# Patient Record
Sex: Female | Born: 1975 | Race: Black or African American | Hispanic: No | Marital: Single | Smoking: Never smoker
Health system: Southern US, Community
[De-identification: ages and names within clinical notes are randomized; demographics above are authoritative.]

## PROBLEM LIST (undated history)

## (undated) DIAGNOSIS — R87618 Other abnormal cytological findings on specimens from cervix uteri: Secondary | ICD-10-CM

## (undated) DIAGNOSIS — Z87410 Personal history of cervical dysplasia: Secondary | ICD-10-CM

## (undated) DIAGNOSIS — R87619 Unspecified abnormal cytological findings in specimens from cervix uteri: Secondary | ICD-10-CM

## (undated) DIAGNOSIS — R8789 Other abnormal findings in specimens from female genital organs: Secondary | ICD-10-CM

## (undated) DIAGNOSIS — F53 Postpartum depression: Secondary | ICD-10-CM

## (undated) DIAGNOSIS — O99345 Other mental disorders complicating the puerperium: Secondary | ICD-10-CM

## (undated) HISTORY — DX: Unspecified abnormal cytological findings in specimens from cervix uteri: R87.619

## (undated) HISTORY — PX: WISDOM TOOTH EXTRACTION: SHX21

## (undated) HISTORY — DX: Other abnormal cytological findings on specimens from cervix uteri: R87.618

## (undated) HISTORY — DX: Other mental disorders complicating the puerperium: O99.345

## (undated) HISTORY — PX: DILATION AND CURETTAGE OF UTERUS: SHX78

## (undated) HISTORY — PX: BREAST BIOPSY: SHX20

## (undated) HISTORY — DX: Personal history of cervical dysplasia: Z87.410

## (undated) HISTORY — DX: Postpartum depression: F53.0

## (undated) HISTORY — DX: Other abnormal findings in specimens from female genital organs: R87.89

---

## 1997-11-14 ENCOUNTER — Encounter: Payer: Self-pay | Admitting: Obstetrics

## 1997-11-14 ENCOUNTER — Inpatient Hospital Stay (HOSPITAL_COMMUNITY): Admission: AD | Admit: 1997-11-14 | Discharge: 1997-11-14 | Payer: Self-pay | Admitting: Obstetrics

## 1998-01-05 HISTORY — PX: BREAST BIOPSY: SHX20

## 1998-02-27 ENCOUNTER — Inpatient Hospital Stay (HOSPITAL_COMMUNITY): Admission: AD | Admit: 1998-02-27 | Discharge: 1998-02-27 | Payer: Self-pay | Admitting: Obstetrics

## 1998-11-26 ENCOUNTER — Inpatient Hospital Stay (HOSPITAL_COMMUNITY): Admission: AD | Admit: 1998-11-26 | Discharge: 1998-11-26 | Payer: Self-pay | Admitting: *Deleted

## 1998-11-29 ENCOUNTER — Inpatient Hospital Stay (HOSPITAL_COMMUNITY): Admission: AD | Admit: 1998-11-29 | Discharge: 1998-11-29 | Payer: Self-pay | Admitting: *Deleted

## 1999-02-21 ENCOUNTER — Inpatient Hospital Stay (HOSPITAL_COMMUNITY): Admission: AD | Admit: 1999-02-21 | Discharge: 1999-02-21 | Payer: Self-pay | Admitting: *Deleted

## 1999-04-19 ENCOUNTER — Inpatient Hospital Stay (HOSPITAL_COMMUNITY): Admission: AD | Admit: 1999-04-19 | Discharge: 1999-04-19 | Payer: Self-pay | Admitting: *Deleted

## 1999-06-08 ENCOUNTER — Inpatient Hospital Stay (HOSPITAL_COMMUNITY): Admission: AD | Admit: 1999-06-08 | Discharge: 1999-06-08 | Payer: Self-pay | Admitting: Obstetrics

## 1999-08-22 ENCOUNTER — Inpatient Hospital Stay (HOSPITAL_COMMUNITY): Admission: AD | Admit: 1999-08-22 | Discharge: 1999-08-22 | Payer: Self-pay | Admitting: Obstetrics & Gynecology

## 1999-08-27 ENCOUNTER — Inpatient Hospital Stay (HOSPITAL_COMMUNITY): Admission: AD | Admit: 1999-08-27 | Discharge: 1999-08-27 | Payer: Self-pay | Admitting: Obstetrics

## 1999-09-12 ENCOUNTER — Inpatient Hospital Stay (HOSPITAL_COMMUNITY): Admission: AD | Admit: 1999-09-12 | Discharge: 1999-09-12 | Payer: Self-pay | Admitting: Obstetrics & Gynecology

## 1999-12-03 ENCOUNTER — Inpatient Hospital Stay (HOSPITAL_COMMUNITY): Admission: AD | Admit: 1999-12-03 | Discharge: 1999-12-03 | Payer: Self-pay | Admitting: Obstetrics

## 2000-01-10 ENCOUNTER — Inpatient Hospital Stay (HOSPITAL_COMMUNITY): Admission: AD | Admit: 2000-01-10 | Discharge: 2000-01-10 | Payer: Self-pay | Admitting: *Deleted

## 2000-02-03 ENCOUNTER — Inpatient Hospital Stay (HOSPITAL_COMMUNITY): Admission: AD | Admit: 2000-02-03 | Discharge: 2000-02-03 | Payer: Self-pay | Admitting: Obstetrics & Gynecology

## 2000-04-11 ENCOUNTER — Inpatient Hospital Stay (HOSPITAL_COMMUNITY): Admission: AD | Admit: 2000-04-11 | Discharge: 2000-04-11 | Payer: Self-pay | Admitting: *Deleted

## 2000-07-08 ENCOUNTER — Inpatient Hospital Stay (HOSPITAL_COMMUNITY): Admission: AD | Admit: 2000-07-08 | Discharge: 2000-07-08 | Payer: Self-pay | Admitting: Obstetrics

## 2000-07-19 ENCOUNTER — Inpatient Hospital Stay (HOSPITAL_COMMUNITY): Admission: EM | Admit: 2000-07-19 | Discharge: 2000-07-19 | Payer: Self-pay | Admitting: Obstetrics & Gynecology

## 2000-07-20 ENCOUNTER — Inpatient Hospital Stay (HOSPITAL_COMMUNITY): Admission: AD | Admit: 2000-07-20 | Discharge: 2000-07-20 | Payer: Self-pay | Admitting: Obstetrics

## 2000-09-18 ENCOUNTER — Inpatient Hospital Stay (HOSPITAL_COMMUNITY): Admission: AD | Admit: 2000-09-18 | Discharge: 2000-09-18 | Payer: Self-pay | Admitting: *Deleted

## 2000-09-23 ENCOUNTER — Encounter: Payer: Self-pay | Admitting: Obstetrics & Gynecology

## 2000-09-23 ENCOUNTER — Inpatient Hospital Stay (HOSPITAL_COMMUNITY): Admission: AD | Admit: 2000-09-23 | Discharge: 2000-09-23 | Payer: Self-pay | Admitting: Obstetrics & Gynecology

## 2001-01-17 ENCOUNTER — Inpatient Hospital Stay (HOSPITAL_COMMUNITY): Admission: AD | Admit: 2001-01-17 | Discharge: 2001-01-17 | Payer: Self-pay | Admitting: Obstetrics

## 2001-09-13 ENCOUNTER — Inpatient Hospital Stay (HOSPITAL_COMMUNITY): Admission: AD | Admit: 2001-09-13 | Discharge: 2001-09-13 | Payer: Self-pay | Admitting: *Deleted

## 2001-10-14 ENCOUNTER — Encounter: Payer: Self-pay | Admitting: Emergency Medicine

## 2001-10-14 ENCOUNTER — Emergency Department (HOSPITAL_COMMUNITY): Admission: EM | Admit: 2001-10-14 | Discharge: 2001-10-14 | Payer: Self-pay | Admitting: Emergency Medicine

## 2001-12-23 ENCOUNTER — Inpatient Hospital Stay (HOSPITAL_COMMUNITY): Admission: AD | Admit: 2001-12-23 | Discharge: 2001-12-23 | Payer: Self-pay | Admitting: Family Medicine

## 2002-01-29 ENCOUNTER — Inpatient Hospital Stay (HOSPITAL_COMMUNITY): Admission: AD | Admit: 2002-01-29 | Discharge: 2002-01-29 | Payer: Self-pay | Admitting: Family Medicine

## 2002-02-09 ENCOUNTER — Other Ambulatory Visit: Admission: RE | Admit: 2002-02-09 | Discharge: 2002-02-09 | Payer: Self-pay | Admitting: *Deleted

## 2002-08-05 ENCOUNTER — Inpatient Hospital Stay (HOSPITAL_COMMUNITY): Admission: AD | Admit: 2002-08-05 | Discharge: 2002-08-05 | Payer: Self-pay | Admitting: Gynecology

## 2002-09-07 ENCOUNTER — Encounter: Payer: Self-pay | Admitting: Family Medicine

## 2002-09-07 ENCOUNTER — Inpatient Hospital Stay (HOSPITAL_COMMUNITY): Admission: AD | Admit: 2002-09-07 | Discharge: 2002-09-07 | Payer: Self-pay | Admitting: Family Medicine

## 2002-12-12 ENCOUNTER — Other Ambulatory Visit: Admission: RE | Admit: 2002-12-12 | Discharge: 2002-12-12 | Payer: Self-pay | Admitting: Obstetrics and Gynecology

## 2002-12-13 ENCOUNTER — Other Ambulatory Visit: Admission: RE | Admit: 2002-12-13 | Discharge: 2002-12-13 | Payer: Self-pay | Admitting: Obstetrics and Gynecology

## 2003-01-04 ENCOUNTER — Inpatient Hospital Stay (HOSPITAL_COMMUNITY): Admission: AD | Admit: 2003-01-04 | Discharge: 2003-01-04 | Payer: Self-pay | Admitting: Obstetrics & Gynecology

## 2003-07-17 ENCOUNTER — Emergency Department (HOSPITAL_COMMUNITY): Admission: EM | Admit: 2003-07-17 | Discharge: 2003-07-18 | Payer: Self-pay | Admitting: Emergency Medicine

## 2004-01-17 ENCOUNTER — Other Ambulatory Visit: Admission: RE | Admit: 2004-01-17 | Discharge: 2004-01-17 | Payer: Self-pay | Admitting: Obstetrics and Gynecology

## 2004-11-01 ENCOUNTER — Inpatient Hospital Stay (HOSPITAL_COMMUNITY): Admission: AD | Admit: 2004-11-01 | Discharge: 2004-11-01 | Payer: Self-pay | Admitting: Obstetrics and Gynecology

## 2005-02-23 ENCOUNTER — Other Ambulatory Visit: Admission: RE | Admit: 2005-02-23 | Discharge: 2005-02-23 | Payer: Self-pay | Admitting: Obstetrics and Gynecology

## 2005-05-26 ENCOUNTER — Inpatient Hospital Stay (HOSPITAL_COMMUNITY): Admission: AD | Admit: 2005-05-26 | Discharge: 2005-05-26 | Payer: Self-pay | Admitting: Obstetrics and Gynecology

## 2010-04-28 DIAGNOSIS — G5600 Carpal tunnel syndrome, unspecified upper limb: Secondary | ICD-10-CM | POA: Insufficient documentation

## 2014-03-09 DIAGNOSIS — R87610 Atypical squamous cells of undetermined significance on cytologic smear of cervix (ASC-US): Secondary | ICD-10-CM | POA: Insufficient documentation

## 2014-06-01 ENCOUNTER — Encounter (HOSPITAL_BASED_OUTPATIENT_CLINIC_OR_DEPARTMENT_OTHER): Payer: Self-pay

## 2014-06-01 ENCOUNTER — Emergency Department (HOSPITAL_BASED_OUTPATIENT_CLINIC_OR_DEPARTMENT_OTHER)
Admission: EM | Admit: 2014-06-01 | Discharge: 2014-06-01 | Disposition: A | Discharge status: Home / Self Care | Payer: Medicaid Other | Attending: Emergency Medicine | Admitting: Emergency Medicine

## 2014-06-01 ENCOUNTER — Emergency Department (HOSPITAL_BASED_OUTPATIENT_CLINIC_OR_DEPARTMENT_OTHER): Payer: Medicaid Other

## 2014-06-01 DIAGNOSIS — Y9289 Other specified places as the place of occurrence of the external cause: Secondary | ICD-10-CM | POA: Insufficient documentation

## 2014-06-01 DIAGNOSIS — W501XXA Accidental kick by another person, initial encounter: Secondary | ICD-10-CM | POA: Diagnosis not present

## 2014-06-01 DIAGNOSIS — Y9389 Activity, other specified: Secondary | ICD-10-CM | POA: Insufficient documentation

## 2014-06-01 DIAGNOSIS — M25532 Pain in left wrist: Secondary | ICD-10-CM

## 2014-06-01 DIAGNOSIS — S6992XA Unspecified injury of left wrist, hand and finger(s), initial encounter: Secondary | ICD-10-CM | POA: Diagnosis not present

## 2014-06-01 DIAGNOSIS — Y990 Civilian activity done for income or pay: Secondary | ICD-10-CM | POA: Diagnosis not present

## 2014-06-01 MED ORDER — IBUPROFEN 800 MG PO TABS
800.0000 mg | ORAL_TABLET | Freq: Once | ORAL | Status: AC
  Administered 2014-06-01: 800 mg via ORAL
  Filled 2014-06-01: qty 1
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
## 2014-06-01 NOTE — Discharge Instructions (Signed)
You may take ibuprofen or naproxen for your pain. RICE: Routine Care for Injuries The routine care of many injuries includes Rest, Ice, Compression, and Elevation (RICE). HOME CARE INSTRUCTIONS  Rest is needed to allow your body to heal. Routine activities can usually be resumed when comfortable. Injured tendons and bones can take up to 6 weeks to heal. Tendons are the cord-like structures that attach muscle to bone.  Ice following an injury helps keep the swelling down and reduces pain.  Put ice in a plastic bag.  Place a towel between your skin and the bag.  Leave the ice on for 15-20 minutes, 3-4 times a day, or as directed by your health care provider. Do this while awake, for the first 24 to 48 hours. After that, continue as directed by your caregiver.  Compression helps keep swelling down. It also gives support and helps with discomfort. If an elastic bandage has been applied, it should be removed and reapplied every 3 to 4 hours. It should not be applied tightly, but firmly enough to keep swelling down. Watch fingers or toes for swelling, bluish discoloration, coldness, numbness, or excessive pain. If any of these problems occur, remove the bandage and reapply loosely. Contact your caregiver if these problems continue.  Elevation helps reduce swelling and decreases pain. With extremities, such as the arms, hands, legs, and feet, the injured area should be placed near or above the level of the heart, if possible. SEEK IMMEDIATE MEDICAL CARE IF:  You have persistent pain and swelling.  You develop redness, numbness, or unexpected weakness.  Your symptoms are getting worse rather than improving after several days. These symptoms may indicate that further evaluation or further X-rays are needed. Sometimes, X-rays may not show a small broken bone (fracture) until 1 week or 10 days later. Make a follow-up appointment with your caregiver. Ask when your X-ray results will be ready. Make sure  you get your X-ray results. Document Released: 04/05/2000 Document Revised: 12/27/2012 Document Reviewed: 05/23/2010 [facility] Patient Information 2015 [place], [place]. This information is not intended to replace advice given to you by your health care provider. Make sure you discuss any questions you have with your health care provider.  Wrist Pain Wrist injuries are frequent in adults and children. A sprain is an injury to the ligaments that hold your bones together. A strain is an injury to muscle or muscle cord-like structures (tendons) from stretching or pulling. Generally, when wrists are moderately tender to touch following a fall or injury, a break in the bone (fracture) may be present. Most wrist sprains or strains are better in 3 to 5 days, but complete healing may take several weeks. HOME CARE INSTRUCTIONS   Put ice on the injured area.  Put ice in a plastic bag.  Place a towel between your skin and the bag.  Leave the ice on for 15-20 minutes, 3-4 times a day, for the first 2 days, or as directed by your health care provider.  Keep your arm raised above the level of your heart whenever possible to reduce swelling and pain.  Rest the injured area for at least 48 hours or as directed by your health care provider.  If a splint or elastic bandage has been applied, use it for as long as directed by your health care provider or until seen by a health care provider for a follow-up exam.  Only take over-the-counter or prescription medicines for pain, discomfort, or fever as directed by your health care  provider.  Keep all follow-up appointments. You may need to follow up with a specialist or have follow-up X-rays. Improvement in pain level is not a guarantee that you did not fracture a bone in your wrist. The only way to determine whether or not you have a broken bone is by X-ray. SEEK IMMEDIATE MEDICAL CARE IF:   Your fingers are swollen, very red, white, or cold and blue.  Your  fingers are numb or tingling.  You have increasing pain.  You have difficulty moving your fingers. MAKE SURE YOU:   Understand these instructions.  Will watch your condition.  Will get help right away if you are not doing well or get worse. Document Released: 10/01/2004 Document Revised: 12/27/2012 Document Reviewed: 02/12/2010 [facility] Patient Information 2015 [place], [place]. This information is not intended to replace advice given to you by your health care provider. Make sure you discuss any questions you have with your health care provider.

## 2014-06-01 NOTE — ED Notes (Signed)
Left wrist injury yesterday-pt wearing velcro splint

## 2015-09-03 ENCOUNTER — Ambulatory Visit
Admission: EM | Admit: 2015-09-03 | Discharge: 2015-09-03 | Discharge status: Absent without leave | Payer: Managed Care, Other (non HMO)

## 2015-09-03 ENCOUNTER — Encounter: Payer: Self-pay | Admitting: Emergency Medicine

## 2015-09-03 ENCOUNTER — Ambulatory Visit
Admission: EM | Admit: 2015-09-03 | Discharge: 2015-09-03 | Disposition: A | Discharge status: Home / Self Care | Payer: Managed Care, Other (non HMO) | Attending: Family Medicine | Admitting: Family Medicine

## 2015-09-03 DIAGNOSIS — H109 Unspecified conjunctivitis: Secondary | ICD-10-CM

## 2015-09-03 DIAGNOSIS — H1132 Conjunctival hemorrhage, left eye: Secondary | ICD-10-CM

## 2015-09-03 MED ORDER — GENTAMICIN SULFATE 0.3 % OP SOLN: 2.0000 [drp] | Freq: Three times a day (TID) | OPHTHALMIC | 0 refills | Status: DC

## 2015-09-03 NOTE — ED Triage Notes (Signed)
Patient c/o redness in her left eye that started yesterday.

## 2015-09-03 NOTE — ED Provider Notes (Signed)
[facility]    CSN: [id number] Arrival date & time: 09/03/15  1944  First Provider Contact:  First MD Initiated Contact with Patient 09/03/15 2030        History   Chief Complaint Chief Complaint  Patient presents with  . Eye Problem    HPI Angel Reilly is a 40 y.o. female.   Patient's here because of his redness of the left eye. She states the redness started yesterday I was red and became irritated today. During the 24 hours that started just got a lot worse with marked redness of the left eye and then also some mucus from eyes well. She denies having anything in her left eye. She states last week she has been troubled for contact lenses she does not wear her contacts now. No previous medical history. She's had wrist surgery for carpal tunnel. She had a D&C and wisdom tooth extracted No known drug allergies. No chronic medications she does not smoke. No pertinent family medical history pertaining or related of significance to this visit.   The history is provided by the patient. No language interpreter was used.  Eye Problem  Location:  Left eye Quality:  Burning Severity:  Moderate Onset quality:  Sudden Timing:  Constant Progression:  Worsening Chronicity:  New Context: not burn, not chemical exposure, not direct trauma, not foreign body and not scratch   Relieved by:  None tried Ineffective treatments:  None tried Associated symptoms: crusting, discharge, itching, redness and tearing   Risk factors: conjunctival hemorrhage     History reviewed. No pertinent past medical history.  There are no active problems to display for this patient.   Past Surgical History:  Procedure Laterality Date  . DILATION AND CURETTAGE OF UTERUS    . WISDOM TOOTH EXTRACTION      OB History    No data available       Home Medications    Prior to Admission medications   Medication Sig Start Date End Date Taking? Authorizing Provider  gentamicin (GARAMYCIN) 0.3  % ophthalmic solution Place 2 drops into the left eye 3 (three) times daily. 09/03/15   Frederich Cha, MD    Family History History reviewed. No pertinent family history.  Social History Social History  Substance Use Topics  . Smoking status: Never Smoker  . Smokeless tobacco: Never Used  . Alcohol use No     Allergies   Review of patient's allergies indicates no known allergies.   Review of Systems Review of Systems  Eyes: Positive for pain, discharge, redness and itching.     Physical Exam Triage Vital Signs ED Triage Vitals  Enc Vitals Group     BP 09/03/15 2023 119/63     Pulse Rate 09/03/15 2023 79     Resp 09/03/15 2023 16     Temp 09/03/15 2023 98 F (36.7 C)     Temp Source 09/03/15 2023 Tympanic     SpO2 09/03/15 2023 100 %     Weight 09/03/15 2024 214 lb (97.1 kg)     Height 09/03/15 2024 5\' 7"  (1.702 m)     Head Circumference --      Peak Flow --      Pain Score 09/03/15 2026 1     Pain Loc --      Pain Edu? --      Excl. in [place]? --    No data found.   Updated Vital Signs BP 119/63 (BP Location: Left Arm)  Pulse 79   Temp 98 F (36.7 C) (Tympanic)   Resp 16   Ht 5\' 7"  (1.702 m)   Wt 214 lb (97.1 kg)   LMP 08/25/2015 (Exact Date)   SpO2 100%   BMI 33.52 kg/m   Visual Acuity Right Eye Distance: 20/20 corrected Left Eye Distance: 20/20 corrected Bilateral Distance:    Right Eye Near:   Left Eye Near:    Bilateral Near:     Physical Exam  Constitutional: She appears well-developed and well-nourished.  HENT:  Head: Normocephalic and atraumatic.  Eyes: EOM and lids are normal. Pupils are equal, round, and reactive to light. Right eye exhibits no discharge. Left eye exhibits no discharge. Left conjunctiva is injected. Left conjunctiva has a hemorrhage.    She has what appears be a subconjunctival hemorrhage about 3:00 she has some redness of eyes probably secondary to pinkeye  Neck: Normal range of motion.  Pulmonary/Chest: Effort  normal.  Musculoskeletal: Normal range of motion.  Neurological: She is alert.  Skin: Skin is warm and dry.  Psychiatric: She has a normal mood and affect.  Vitals reviewed.    UC Treatments / Results  Labs (all labs ordered are listed, but only abnormal results are displayed) Labs Reviewed - No data to display  EKG  EKG Interpretation None       Radiology No results found.  Procedures Procedures (including critical care time)  Medications Ordered in UC Medications - No data to display   Initial Impression / Assessment and Plan / UC Course  I have reviewed the triage vital signs and the nursing notes.  Pertinent labs & imaging results that were available during my care of the patient were reviewed by me and considered in my medical decision making (see chart for details).  Clinical Course    Patient uptake has a septic for hemorrhage which I think is coming from self inflicted rubbing from having a conjunctivitis. Recommend she not touch her eye. Gentamycineye drops 2 drops in the left eye 3 times a day. Work note given for tomorrow. Follow-up ophthalmologist choice if not better in the next 48 hours. No contacts for at least a week.  Final Clinical Impressions(s) / UC Diagnoses   Final diagnoses:  Conjunctivitis of left eye  Subconjunctival hemorrhage, left    New Prescriptions New Prescriptions   GENTAMICIN (GARAMYCIN) 0.3 % OPHTHALMIC SOLUTION    Place 2 drops into the left eye 3 (three) times daily.     Frederich Cha, MD 09/03/15 2052

## 2015-11-22 ENCOUNTER — Encounter (HOSPITAL_BASED_OUTPATIENT_CLINIC_OR_DEPARTMENT_OTHER): Payer: Self-pay | Admitting: *Deleted

## 2015-11-22 ENCOUNTER — Emergency Department (HOSPITAL_BASED_OUTPATIENT_CLINIC_OR_DEPARTMENT_OTHER)
Admission: EM | Admit: 2015-11-22 | Discharge: 2015-11-22 | Disposition: A | Discharge status: Home / Self Care | Payer: Managed Care, Other (non HMO) | Attending: Emergency Medicine | Admitting: Emergency Medicine

## 2015-11-22 ENCOUNTER — Emergency Department (HOSPITAL_BASED_OUTPATIENT_CLINIC_OR_DEPARTMENT_OTHER): Payer: Managed Care, Other (non HMO)

## 2015-11-22 DIAGNOSIS — S93601A Unspecified sprain of right foot, initial encounter: Secondary | ICD-10-CM

## 2015-11-22 DIAGNOSIS — X58XXXA Exposure to other specified factors, initial encounter: Secondary | ICD-10-CM | POA: Diagnosis not present

## 2015-11-22 DIAGNOSIS — Y939 Activity, unspecified: Secondary | ICD-10-CM | POA: Diagnosis not present

## 2015-11-22 DIAGNOSIS — Y929 Unspecified place or not applicable: Secondary | ICD-10-CM | POA: Diagnosis not present

## 2015-11-22 DIAGNOSIS — Y999 Unspecified external cause status: Secondary | ICD-10-CM | POA: Insufficient documentation

## 2015-11-22 DIAGNOSIS — S99921A Unspecified injury of right foot, initial encounter: Secondary | ICD-10-CM | POA: Diagnosis present

## 2015-11-22 NOTE — ED Provider Notes (Signed)
[facility] Provider Note   CSN: [id number] Arrival date & time: 11/22/15  1120     History   Chief Complaint No chief complaint on file.   HPI Angel Reilly is a 40 y.o. female.  HPI   40 year old female presents today with right foot injury. Patient reports last night she was wearing high heels and and had to make an abrupt change in direction. She reports she felt a sharp pain in her right midfoot. She notes symptoms went away immediately, had no pain through the night, was able to dance and walk without difficulty. Patient reports this morning she felt sharp pain in the medial aspect of the right foot, this is not reproducible, she denies any swelling or edema, denies any signs of injury. Patient reports she is able to walk without significant difficulty. No history of the same.  History reviewed. No pertinent past medical history.  There are no active problems to display for this patient.   Past Surgical History:  Procedure Laterality Date  . DILATION AND CURETTAGE OF UTERUS    . WISDOM TOOTH EXTRACTION      OB History    No data available       Home Medications    Prior to Admission medications   Medication Sig Start Date End Date Taking? Authorizing Provider  gentamicin (GARAMYCIN) 0.3 % ophthalmic solution Place 2 drops into the left eye 3 (three) times daily. 09/03/15   Frederich Cha, MD    Family History No family history on file.  Social History Social History  Substance Use Topics  . Smoking status: Never Smoker  . Smokeless tobacco: Never Used  . Alcohol use No     Allergies   Patient has no known allergies.   Review of Systems Review of Systems  All other systems reviewed and are negative.    Physical Exam Updated Vital Signs BP 134/76   Pulse 73   Temp 98 F (36.7 C) (Oral)   Resp 18   Ht 5\' 7"  (1.702 m)   Wt 97.5 kg   LMP 10/28/2015   SpO2 100%   BMI 33.67 kg/m   Physical Exam  Constitutional: She is  oriented to person, place, and time. She appears well-developed and well-nourished.  HENT:  Head: Normocephalic and atraumatic.  Eyes: Conjunctivae are normal. Pupils are equal, round, and reactive to light. Right eye exhibits no discharge. Left eye exhibits no discharge. No scleral icterus.  Neck: Normal range of motion. No JVD present. No tracheal deviation present.  Pulmonary/Chest: Effort normal. No stridor.  Musculoskeletal:  Right foot atraumatic, nontender to palpation. No laxity of the foot or ankle, I cannot reproduce pain.  Neurological: She is alert and oriented to person, place, and time. Coordination normal.  Psychiatric: She has a normal mood and affect. Her behavior is normal. Judgment and thought content normal.  Nursing note and vitals reviewed.    ED Treatments / Results  Labs (all labs ordered are listed, but only abnormal results are displayed) Labs Reviewed - No data to display  EKG  EKG Interpretation None       Radiology Dg Foot Complete Right  Result Date: 11/22/2015 CLINICAL DATA:  Twisted foot.  Injury.  Pain EXAM: RIGHT FOOT COMPLETE - 3+ VIEW COMPARISON:  None. FINDINGS: There is no evidence of fracture or dislocation. There is no evidence of arthropathy or other focal bone abnormality. Soft tissues are unremarkable. IMPRESSION: Negative. Electronically Signed   By: Franchot Gallo  M.D.   On: 11/22/2015 11:50    Procedures Procedures (including critical care time)  Medications Ordered in ED Medications - No data to display   Initial Impression / Assessment and Plan / ED Course  I have reviewed the triage vital signs and the nursing notes.  Pertinent labs & imaging results that were available during my care of the patient were reviewed by me and considered in my medical decision making (see chart for details).  Clinical Course      Final Clinical Impressions(s) / ED Diagnoses   Final diagnoses:  Sprain of right foot, initial encounter      Labs:  Imaging:  Consults:  Therapeutics:  Discharge Meds:   Assessment/Plan:   40 year old female presents today with likely foot sprain. I am unable to reproduce pain on my exam, she has no signs of trauma, negative plain films. She be discharged home with follow-up with sports medicine if symptoms persist. Patient verbalized understanding and agreement today's plan had no further questions or concerns    New Prescriptions New Prescriptions   No medications on file     Okey Regal, PA-C 11/22/15 [place], MD 11/24/15 1159

## 2015-11-22 NOTE — Discharge Instructions (Signed)
Please read attached information. If you experience any new or worsening signs or symptoms please return to the emergency room for evaluation. Please follow-up with your primary care provider or specialist as discussed.  °

## 2016-01-06 HISTORY — PX: BREAST BIOPSY: SHX20

## 2016-01-13 LAB — HM PAP SMEAR: HM PAP: NEGATIVE

## 2016-01-27 ENCOUNTER — Other Ambulatory Visit: Payer: Self-pay | Admitting: Obstetrics & Gynecology

## 2016-01-27 DIAGNOSIS — N63 Unspecified lump in unspecified breast: Secondary | ICD-10-CM

## 2016-02-06 ENCOUNTER — Ambulatory Visit
Admission: RE | Admit: 2016-02-06 | Discharge: 2016-02-06 | Disposition: A | Discharge status: Home / Self Care | Payer: Managed Care, Other (non HMO) | Source: Ambulatory Visit | Attending: Obstetrics & Gynecology | Admitting: Obstetrics & Gynecology

## 2016-02-06 DIAGNOSIS — N63 Unspecified lump in unspecified breast: Secondary | ICD-10-CM

## 2016-02-06 DIAGNOSIS — N6321 Unspecified lump in the left breast, upper outer quadrant: Secondary | ICD-10-CM | POA: Diagnosis not present

## 2016-02-06 DIAGNOSIS — N6320 Unspecified lump in the left breast, unspecified quadrant: Secondary | ICD-10-CM | POA: Diagnosis present

## 2016-02-06 LAB — HM MAMMOGRAPHY

## 2016-02-12 ENCOUNTER — Other Ambulatory Visit: Payer: Self-pay | Admitting: Obstetrics & Gynecology

## 2016-02-12 DIAGNOSIS — N6320 Unspecified lump in the left breast, unspecified quadrant: Secondary | ICD-10-CM

## 2016-02-12 DIAGNOSIS — R928 Other abnormal and inconclusive findings on diagnostic imaging of breast: Secondary | ICD-10-CM

## 2016-02-25 ENCOUNTER — Ambulatory Visit: Payer: Managed Care, Other (non HMO)

## 2016-03-06 ENCOUNTER — Ambulatory Visit: Payer: Managed Care, Other (non HMO)

## 2016-04-20 ENCOUNTER — Ambulatory Visit
Admission: EM | Admit: 2016-04-20 | Discharge: 2016-04-20 | Disposition: A | Discharge status: Home / Self Care | Payer: Managed Care, Other (non HMO) | Attending: Family Medicine | Admitting: Family Medicine

## 2016-04-20 DIAGNOSIS — R5383 Other fatigue: Secondary | ICD-10-CM

## 2016-04-20 DIAGNOSIS — R05 Cough: Secondary | ICD-10-CM

## 2016-04-20 DIAGNOSIS — R062 Wheezing: Secondary | ICD-10-CM

## 2016-04-20 DIAGNOSIS — R059 Cough, unspecified: Secondary | ICD-10-CM

## 2016-04-20 MED ORDER — AZITHROMYCIN 250 MG PO TABS: ORAL_TABLET | ORAL | 0 refills | Status: DC

## 2016-04-20 MED ORDER — ALBUTEROL SULFATE HFA 108 (90 BASE) MCG/ACT IN AERS: 1.0000 | INHALATION_SPRAY | Freq: Four times a day (QID) | RESPIRATORY_TRACT | 0 refills | Status: DC | PRN

## 2016-04-20 MED ORDER — HYDROCOD POLST-CPM POLST ER 10-8 MG/5ML PO SUER: 5.0000 mL | Freq: Every evening | ORAL | 0 refills | Status: DC | PRN

## 2016-04-20 NOTE — ED Provider Notes (Signed)
[facility]    CSN: [id number] Arrival date & time: 04/20/16  1450     History   Chief Complaint Chief Complaint  Patient presents with  . Sinusitis    HPI Angel Reilly is a 41 y.o. female.    Sinusitis  Associated symptoms: congestion, cough, fatigue and wheezing   URI  Presenting symptoms: congestion, cough and fatigue   Severity:  Moderate Onset quality:  Sudden Timing:  Constant Progression:  Worsening Chronicity:  New Relieved by:  Nebulizer treatments (states she used her daughter's nebulizer last night with some relief) Worsened by:  Nothing Associated symptoms: wheezing   Risk factors: sick contacts   Risk factors: not elderly, no chronic cardiac disease, no chronic kidney disease, no chronic respiratory disease, no diabetes mellitus, no immunosuppression, no recent illness and no recent travel     History reviewed. No pertinent past medical history.  There are no active problems to display for this patient.   Past Surgical History:  Procedure Laterality Date  . BREAST BIOPSY Left 2000   benign results  . DILATION AND CURETTAGE OF UTERUS    . WISDOM TOOTH EXTRACTION      OB History    No data available       Home Medications    Prior to Admission medications   Medication Sig Start Date End Date Taking? Authorizing Provider  albuterol (PROVENTIL HFA;VENTOLIN HFA) 108 (90 Base) MCG/ACT inhaler Inhale 1-2 puffs into the lungs every 6 (six) hours as needed for wheezing or shortness of breath. 04/20/16   Norval Gable, MD  azithromycin (ZITHROMAX Z-PAK) 250 MG tablet 2 tabs po once day 1, then 1 tab po qd for next 4 days 04/20/16   Norval Gable, MD  chlorpheniramine-HYDROcodone [facility] ER) 10-8 MG/5ML SUER Take 5 mLs by mouth at bedtime as needed. 04/20/16   Norval Gable, MD    Family History Family History  Problem Relation Age of Onset  . Breast cancer Maternal Aunt     mid 75's    Social History Social History   Substance Use Topics  . Smoking status: Never Smoker  . Smokeless tobacco: Never Used  . Alcohol use No     Allergies   Patient has no known allergies.   Review of Systems Review of Systems  Constitutional: Positive for fatigue.  HENT: Positive for congestion.   Respiratory: Positive for cough and wheezing.      Physical Exam Triage Vital Signs ED Triage Vitals  Enc Vitals Group     BP 04/20/16 1543 107/85     Pulse Rate 04/20/16 1543 95     Resp 04/20/16 1543 18     Temp 04/20/16 1543 99.8 F (37.7 C)     Temp Source 04/20/16 1543 Oral     SpO2 04/20/16 1543 100 %     Weight 04/20/16 1541 219 lb (99.3 kg)     Height 04/20/16 1541 5\' 7"  (1.702 m)     Head Circumference --      Peak Flow --      Pain Score 04/20/16 1541 8     Pain Loc --      Pain Edu? --      Excl. in [place]? --    No data found.   Updated Vital Signs BP 107/85 (BP Location: Left Arm)   Pulse 95   Temp 99.8 F (37.7 C) (Oral)   Resp 18   Ht 5\' 7"  (1.702 m)   Wt  219 lb (99.3 kg)   LMP 04/01/2016   SpO2 100%   BMI 34.30 kg/m   Visual Acuity Right Eye Distance:   Left Eye Distance:   Bilateral Distance:    Right Eye Near:   Left Eye Near:    Bilateral Near:     Physical Exam  Constitutional: She appears well-developed and well-nourished. No distress.  HENT:  Head: Normocephalic and atraumatic.  Right Ear: Tympanic membrane, external ear and ear canal normal.  Left Ear: Tympanic membrane, external ear and ear canal normal.  Nose: No mucosal edema, rhinorrhea, nose lacerations, sinus tenderness, nasal deformity, septal deviation or nasal septal hematoma. No epistaxis.  No foreign bodies. Right sinus exhibits no maxillary sinus tenderness and no frontal sinus tenderness. Left sinus exhibits no maxillary sinus tenderness and no frontal sinus tenderness.  Mouth/Throat: Uvula is midline, oropharynx is clear and moist and mucous membranes are normal. No oropharyngeal exudate.  Eyes:  Conjunctivae and EOM are normal. Pupils are equal, round, and reactive to light. Right eye exhibits no discharge. Left eye exhibits no discharge. No scleral icterus.  Neck: Normal range of motion. Neck supple. No thyromegaly present.  Cardiovascular: Normal rate, regular rhythm and normal heart sounds.   Pulmonary/Chest: Effort normal. No respiratory distress. She has wheezes (expiratory diffusely and rhonchi). She has no rales.  Lymphadenopathy:    She has no cervical adenopathy.  Skin: She is not diaphoretic.  Nursing note and vitals reviewed.    UC Treatments / Results  Labs (all labs ordered are listed, but only abnormal results are displayed) Labs Reviewed - No data to display  EKG  EKG Interpretation None       Radiology No results found.  Procedures Procedures (including critical care time)  Medications Ordered in UC Medications - No data to display   Initial Impression / Assessment and Plan / UC Course  I have reviewed the triage vital signs and the nursing notes.  Pertinent labs & imaging results that were available during my care of the patient were reviewed by me and considered in my medical decision making (see chart for details).      Final Clinical Impressions(s) / UC Diagnoses   Final diagnoses:  Cough  Wheezing    New Prescriptions Discharge Medication List as of 04/20/2016  4:00 PM    START taking these medications   Details  albuterol (PROVENTIL HFA;VENTOLIN HFA) 108 (90 Base) MCG/ACT inhaler Inhale 1-2 puffs into the lungs every 6 (six) hours as needed for wheezing or shortness of breath., Starting Mon 04/20/2016, Normal    azithromycin (ZITHROMAX Z-PAK) 250 MG tablet 2 tabs po once day 1, then 1 tab po qd for next 4 days, Normal    chlorpheniramine-HYDROcodone (TUSSIONEX PENNKINETIC ER) 10-8 MG/5ML SUER Take 5 mLs by mouth at bedtime as needed., Starting Mon 04/20/2016, Normal       1.diagnosis reviewed with patient 2. rx as per orders  above; reviewed possible side effects, interactions, risks and benefits  3. Recommend supportive treatment with rest, fluids 4. Follow-up prn if symptoms worsen or don't improve   Norval Gable, MD 04/20/16 [phone]

## 2016-04-20 NOTE — ED Triage Notes (Signed)
Pt c/o pressure in her head, shortness of breath, cant lay down flat. She used some of her daughter albuterol and that helped a little. Today she has body aches and chills.

## 2016-08-18 ENCOUNTER — Encounter (HOSPITAL_BASED_OUTPATIENT_CLINIC_OR_DEPARTMENT_OTHER): Payer: Self-pay | Admitting: *Deleted

## 2016-08-18 ENCOUNTER — Emergency Department (HOSPITAL_BASED_OUTPATIENT_CLINIC_OR_DEPARTMENT_OTHER)
Admission: EM | Admit: 2016-08-18 | Discharge: 2016-08-18 | Disposition: A | Discharge status: Home / Self Care | Payer: Managed Care, Other (non HMO) | Attending: Emergency Medicine | Admitting: Emergency Medicine

## 2016-08-18 DIAGNOSIS — R519 Headache, unspecified: Secondary | ICD-10-CM

## 2016-08-18 DIAGNOSIS — R51 Headache: Secondary | ICD-10-CM | POA: Diagnosis present

## 2016-08-18 DIAGNOSIS — Z79899 Other long term (current) drug therapy: Secondary | ICD-10-CM | POA: Insufficient documentation

## 2016-08-18 MED ORDER — METOCLOPRAMIDE HCL 5 MG/ML IJ SOLN
10.0000 mg | Freq: Once | INTRAMUSCULAR | Status: AC
  Administered 2016-08-18: 10 mg via INTRAVENOUS
  Filled 2016-08-18: qty 2

## 2016-08-18 MED ORDER — DIPHENHYDRAMINE HCL 50 MG/ML IJ SOLN
25.0000 mg | Freq: Once | INTRAMUSCULAR | Status: AC
  Administered 2016-08-18: 25 mg via INTRAVENOUS
  Filled 2016-08-18: qty 1

## 2016-08-18 NOTE — ED Notes (Signed)
Pt educated about the risks of driving if she becomes drowsy from Benadryl. Pt verbalized understanding.

## 2016-08-18 NOTE — ED Provider Notes (Signed)
[place] [facility] Provider Note   CSN: [id number] Arrival date & time: 08/18/16  1222     History   Chief Complaint Chief Complaint  Patient presents with  . Headache    HPI Angel Reilly is a 41 y.o. female.Complains of frontal headache gradual in onset 1 PM yesterday constant since is initially accompanied by floaters in both eyes which have since resolved. She treated herself with ibuprofen and Aleve yesterday, without relief. She denies photophobia denies fever denies nausea. She reports that she gets headaches approximately once per month however not like this. She was seen by a clinic earlier today and advised to come here for further evaluation. Nothing makes symptoms better or worse. No other associated symptoms  HPI  History reviewed. No pertinent past medical history.  There are no active problems to display for this patient. Past medical history negative  Past Surgical History:  Procedure Laterality Date  . BREAST BIOPSY Left 2000   benign results  . DILATION AND CURETTAGE OF UTERUS    . WISDOM TOOTH EXTRACTION      OB History    No data available       Home Medications    Prior to Admission medications   Medication Sig Start Date End Date Taking? Authorizing Provider  albuterol (PROVENTIL HFA;VENTOLIN HFA) 108 (90 Base) MCG/ACT inhaler Inhale 1-2 puffs into the lungs every 6 (six) hours as needed for wheezing or shortness of breath. 04/20/16   Norval Gable, MD  azithromycin (ZITHROMAX Z-PAK) 250 MG tablet 2 tabs po once day 1, then 1 tab po qd for next 4 days 04/20/16   Norval Gable, MD  chlorpheniramine-HYDROcodone [facility] PENNKINETIC ER) 10-8 MG/5ML SUER Take 5 mLs by mouth at bedtime as needed. 04/20/16   Norval Gable, MD    Family History Family History  Problem Relation Age of Onset  . Breast cancer Maternal Aunt        mid 58's    Social History Social History  Substance Use Topics  . Smoking status: Never Smoker  .  Smokeless tobacco: Never Used  . Alcohol use No  No illicit drug use   Allergies   Patient has no known allergies.   Review of Systems Review of Systems  Constitutional: Negative.   HENT: Negative.   Eyes: Positive for visual disturbance.       [place] both eyes which have resolved  Respiratory: Negative.   Cardiovascular: Negative.   Gastrointestinal: Negative.   Musculoskeletal: Negative.   Skin: Negative.   Neurological: Positive for headaches.  Psychiatric/Behavioral: Negative.   All other systems reviewed and are negative.    Physical Exam Updated Vital Signs BP 108/67 (BP Location: Left Arm)   Pulse 74   Temp 98.3 F (36.8 C) (Oral)   Resp 16   Ht 5\' 7"  (1.702 m)   Wt 101.6 kg (224 lb)   LMP 08/16/2016   SpO2 99%   BMI 35.08 kg/m   Physical Exam  Constitutional: She is oriented to person, place, and time. She appears well-developed and well-nourished.  HENT:  Head: Normocephalic and atraumatic.  Eyes: Pupils are equal, round, and reactive to light. Conjunctivae are normal.  Neck: Neck supple. No tracheal deviation present. No thyromegaly present.  Cardiovascular: Normal rate and regular rhythm.   No murmur heard. Pulmonary/Chest: Effort normal and breath sounds normal.  Abdominal: Soft. Bowel sounds are normal. She exhibits no distension. There is no tenderness.  Musculoskeletal: Normal range of motion. She exhibits no  edema or tenderness.  Neurological: She is alert and oriented to person, place, and time. Coordination normal.  Gait normal Romberg normal pronator drift normal finger to nose normal DTR symmetric bilaterally at knee jerk ankle jerk and biceps toes downward going bilaterally cranial nerves II through XII grossly intact  Skin: Skin is warm and dry. No rash noted.  Psychiatric: She has a normal mood and affect.  Nursing note and vitals reviewed.    ED Treatments / Results  Labs (all labs ordered are listed, but only abnormal results  are displayed) Labs Reviewed - No data to display  EKG  EKG Interpretation None       Radiology No results found.  Procedures Procedures (including critical care time)  Medications Ordered in ED Medications  metoCLOPramide (REGLAN) injection 10 mg (10 mg Intravenous Given 08/18/16 1412)  diphenhydrAMINE (BENADRYL) injection 25 mg (25 mg Intravenous Given 08/18/16 1409)     Initial Impression / Assessment and Plan / ED Course  I have reviewed the triage vital signs and the nursing notes.  Pertinent labs & imaging results that were available during my care of the patient were reviewed by me and considered in my medical decision making (see chart for details).   2:50 PM take much improved after treatment with intravenous Reglan and Benadryl. She is an in no distress.  CT scan offered the patient which she declines. I do not feel the patient needs emergent CT. She is instructed to follow-up with her PMD if headaches continue or return if concerned for any reason    Final Clinical Impressions(s) / ED Diagnoses   Final diagnoses:  None  Diagnosis nonspecific headache  New Prescriptions New Prescriptions   No medications on file     Orlie Dakin, MD 08/18/16 1501

## 2016-08-18 NOTE — ED Triage Notes (Signed)
Headache since yesterday. States she seen spots. Hx of same.

## 2016-08-18 NOTE — Discharge Instructions (Signed)
Contact your primary care physician or return if not continuing to feel better in 2 or 3 days take Tylenol or Advil for pain

## 2016-10-27 ENCOUNTER — Other Ambulatory Visit: Payer: Self-pay | Admitting: Obstetrics & Gynecology

## 2016-10-27 DIAGNOSIS — R928 Other abnormal and inconclusive findings on diagnostic imaging of breast: Secondary | ICD-10-CM

## 2016-10-27 DIAGNOSIS — N6320 Unspecified lump in the left breast, unspecified quadrant: Secondary | ICD-10-CM

## 2016-11-05 ENCOUNTER — Ambulatory Visit
Admission: RE | Admit: 2016-11-05 | Discharge: 2016-11-05 | Disposition: A | Discharge status: Home / Self Care | Payer: Managed Care, Other (non HMO) | Source: Ambulatory Visit | Attending: Obstetrics & Gynecology | Admitting: Obstetrics & Gynecology

## 2016-11-05 DIAGNOSIS — R928 Other abnormal and inconclusive findings on diagnostic imaging of breast: Secondary | ICD-10-CM

## 2016-11-05 DIAGNOSIS — N6320 Unspecified lump in the left breast, unspecified quadrant: Secondary | ICD-10-CM

## 2016-11-05 DIAGNOSIS — D242 Benign neoplasm of left breast: Secondary | ICD-10-CM | POA: Insufficient documentation

## 2016-11-05 DIAGNOSIS — N6321 Unspecified lump in the left breast, upper outer quadrant: Secondary | ICD-10-CM | POA: Diagnosis present

## 2016-11-06 LAB — SURGICAL PATHOLOGY

## 2017-01-14 ENCOUNTER — Encounter: Payer: Self-pay | Admitting: Obstetrics & Gynecology

## 2017-01-15 ENCOUNTER — Ambulatory Visit (INDEPENDENT_AMBULATORY_CARE_PROVIDER_SITE_OTHER): Payer: BLUE CROSS/BLUE SHIELD | Admitting: Obstetrics & Gynecology

## 2017-01-15 ENCOUNTER — Encounter: Payer: Self-pay | Admitting: Obstetrics & Gynecology

## 2017-01-15 VITALS — BP 110/70 | HR 88 | Ht 67.0 in | Wt 224.0 lb

## 2017-01-15 DIAGNOSIS — Z87410 Personal history of cervical dysplasia: Secondary | ICD-10-CM | POA: Diagnosis not present

## 2017-01-15 DIAGNOSIS — Z0000 Encounter for general adult medical examination without abnormal findings: Secondary | ICD-10-CM

## 2017-01-15 DIAGNOSIS — Z01419 Encounter for gynecological examination (general) (routine) without abnormal findings: Secondary | ICD-10-CM | POA: Diagnosis not present

## 2017-01-15 HISTORY — DX: Personal history of cervical dysplasia: Z87.410

## 2017-01-15 MED ORDER — BORIC ACID CRYS: 600.0000 mg | CRYSTALS | 5 refills | Status: DC

## 2017-01-15 NOTE — Progress Notes (Signed)
HPI:      [name] is a 42 y.o. [phone] who LMP was Patient's last menstrual period was 12/25/2016., she presents today for her annual examination. The patient has no complaints today. The patient is sexually active. Her last pap: approximate date 01/2016 and was normal and prior ASCUS PAP and last mammogram: with [place] for left breast mass was in Nov 2018. The patient does perform self breast exams.  There is no notable family history of breast or ovarian cancer in her family.  The patient has regular exercise: yes.  The patient denies current symptoms of depression.  Has recurrent issues w vag d/c and BV.  GYN History: Contraception: none  PMHx: Past Medical History:  Diagnosis Date  . Abnormal Pap smear of cervix   . Pap smear abnormality of cervix/human papillomavirus (HPV) positive    Past Surgical History:  Procedure Laterality Date  . BREAST BIOPSY Left 2000   benign results  . BREAST BIOPSY Left 2018  . DILATION AND CURETTAGE OF UTERUS    . WISDOM TOOTH EXTRACTION     Family History  Problem Relation Age of Onset  . Breast cancer Maternal Aunt        mid 3's  . Colon cancer Maternal Aunt    Social History   Tobacco Use  . Smoking status: Never Smoker  . Smokeless tobacco: Never Used  Substance Use Topics  . Alcohol use: No  . Drug use: No    Current Outpatient Medications:  .  albuterol (PROVENTIL HFA;VENTOLIN HFA) 108 (90 Base) MCG/ACT inhaler, Inhale 1-2 puffs into the lungs every 6 (six) hours as needed for wheezing or shortness of breath., Disp: 1 Inhaler, Rfl: 0 .  azithromycin (ZITHROMAX Z-PAK) 250 MG tablet, 2 tabs po once day 1, then 1 tab po qd for next 4 days, Disp: 6 each, Rfl: 0 .  chlorpheniramine-HYDROcodone (TUSSIONEX PENNKINETIC ER) 10-8 MG/5ML SUER, Take 5 mLs by mouth at bedtime as needed., Disp: 100 mL, Rfl: 0 Allergies: Patient has no known allergies.  Review of Systems  Constitutional: Negative for chills, fever and  malaise/fatigue.  HENT: Negative for congestion, sinus pain and sore throat.   Eyes: Negative for blurred vision and pain.  Respiratory: Negative for cough and wheezing.   Cardiovascular: Negative for chest pain and leg swelling.  Gastrointestinal: Negative for abdominal pain, constipation, diarrhea, heartburn, nausea and vomiting.  Genitourinary: Negative for dysuria, frequency, hematuria and urgency.  Musculoskeletal: Negative for back pain, joint pain, myalgias and neck pain.  Skin: Negative for itching and rash.  Neurological: Negative for dizziness, tremors and weakness.  Endo/Heme/Allergies: Does not bruise/bleed easily.  Psychiatric/Behavioral: Negative for depression. The patient is not nervous/anxious and does not have insomnia.     Objective: BP 110/70   Pulse 88   Ht 5\' 7"  (1.702 m)   Wt 224 lb (101.6 kg)   LMP 12/25/2016   BMI 35.08 kg/m   Filed Weights   01/15/17 1547  Weight: 224 lb (101.6 kg)   Body mass index is 35.08 kg/m. Physical Exam  Constitutional: She is oriented to person, place, and time. She appears well-developed and well-nourished. No distress.  Genitourinary: Rectum normal, vagina normal and uterus normal. Pelvic exam was performed with patient supine. There is no rash or lesion on the right labia. There is no rash or lesion on the left labia. Vagina exhibits no lesion. No bleeding in the vagina. Right adnexum does not display mass and does not display  tenderness. Left adnexum does not display mass and does not display tenderness. Cervix does not exhibit motion tenderness, lesion, friability or polyp.   Uterus is mobile and midaxial. Uterus is not enlarged or exhibiting a mass.  HENT:  Head: Normocephalic and atraumatic. Head is without laceration.  Right Ear: Hearing normal.  Left Ear: Hearing normal.  Nose: No epistaxis.  No foreign bodies.  Mouth/Throat: Uvula is midline, oropharynx is clear and moist and mucous membranes are normal.  Eyes: Pupils  are equal, round, and reactive to light.  Neck: Normal range of motion. Neck supple. No thyromegaly present.  Cardiovascular: Normal rate and regular rhythm. Exam reveals no gallop and no friction rub.  No murmur heard. Pulmonary/Chest: Effort normal and breath sounds normal. No respiratory distress. She has no wheezes. Right breast exhibits no mass, no skin change and no tenderness. Left breast exhibits no mass, no skin change and no tenderness.  Abdominal: Soft. Bowel sounds are normal. She exhibits no distension. There is no tenderness. There is no rebound.  Musculoskeletal: Normal range of motion.  Neurological: She is alert and oriented to person, place, and time. No cranial nerve deficit.  Skin: Skin is warm and dry.  Psychiatric: She has a normal mood and affect. Judgment normal.  Vitals reviewed.  Assessment:  ANNUAL EXAM 1. Annual physical exam   2. History of cervical dysplasia    Screening Plan:            1.  Cervical Screening-  Pap smear done today  2. Breast screening- Exam annually and mammogram>40 planned.  Plan for MMG next year.  Biopsy of left breast in 11/18 normal.  3. Colonoscopy every 10 years, Hemoccult testing - after age 18  4. Labs managed by PCP  5. Counseling for contraception: no method (desires future pregnancy someday)  6. Boric Acid for pH and BV prevention    F/U  Return in about 1 year (around 01/15/2018) for Annual.  Barnett Applebaum, MD, [facility], [facility] 01/15/2017  3:50 PM

## 2017-01-15 NOTE — Addendum Note (Signed)
Addended by: Gae Dry on: 01/15/2017 04:10 PM   Modules accepted: Orders

## 2017-01-15 NOTE — Patient Instructions (Addendum)
PAP every  year Mammogram every year    Call [phone] to schedule at [facility] (next year) Labs - work  Boric Acid vaginal suppository What is this medicine? BORIC ACID (BOHR ik AS id) helps to promote the proper acid balance in the vagina. It is used to help treat yeast infections of the vagina and relieve symptoms such as itching and burning. This medicine may be used for other purposes; ask your health care provider or pharmacist if you have questions. COMMON BRAND NAME(S): Hylafem What should I tell my health care provider before I take this medicine? They need to know if you have any of these conditions: -diabetes -frequent infections -HIV or AIDS -immune system problems -an unusual or allergic reaction to boric acid, other medicines, foods, dyes, or preservatives -pregnant or trying to get pregnant -breast-feeding How should I use this medicine? This medicine is for use in the vagina. Do not take by mouth. Follow the directions on the prescription label. Read package directions carefully before using. Wash hands before and after use. Use this medicine at bedtime, unless otherwise directed by your doctor. Do not use your medicine more often than directed. Do not stop using this medicine except on your doctor's advice. Talk to your pediatrician regarding the use of this medicine in children. This medicine is not approved for use in children. Overdosage: If you think you have taken too much of this medicine contact a poison control center or emergency room at once. NOTE: This medicine is only for you. Do not share this medicine with others. What if I miss a dose? If you miss a dose, use it as soon as you can. If it is almost time for your next dose, use only that dose. Do not use double or extra doses. What may interact with this medicine? Interactions are not expected. Do not use any other vaginal products without telling your doctor or health care professional. This list may not  describe all possible interactions. Give your health care provider a list of all the medicines, herbs, non-prescription drugs, or dietary supplements you use. Also tell them if you smoke, drink alcohol, or use illegal drugs. Some items may interact with your medicine. What should I watch for while using this medicine? Tell your doctor or health care professional if your symptoms do not start to get better within a few days. It is better not to have sex until you have finished your treatment. This medicine may damage condoms or diaphragms and cause them not to work properly. It may also decrease the effect of vaginal spermicides. Do not rely on any of these methods to prevent sexually transmitted diseases or pregnancy while you are using this medicine. Vaginal medicines usually will come out of the vagina during treatment. To keep the medicine from getting on your clothing, wear a panty liner. The use of tampons is not recommended. To help clear up the infection, wear freshly washed cotton, not synthetic, underwear. What side effects may I notice from receiving this medicine? Side effects that you should report to your doctor or health care professional as soon as possible: -allergic reactions like skin rash, itching or hives -vaginal irritation, redness, or burning Side effects that usually do not require medical attention (report to your doctor or health care professional if they continue or are bothersome): -vaginal discharge This list may not describe all possible side effects. Call your doctor for medical advice about side effects. You may report side effects to FDA at 1-800-FDA-1088.  Where should I keep my medicine? Keep out of the reach of children. Store in a cool, dry place between 15 and 30 degrees C (59 and 86 degrees F). Keep away from sunlight. Throw away any unused medicine after the expiration date. NOTE: This sheet is a summary. It may not cover all possible information. If you have  questions about this medicine, talk to your doctor, pharmacist, or health care provider.  2018 Elsevier/Gold Standard (2015-01-24 07:29:58)

## 2017-01-19 LAB — PAP IG (IMAGE GUIDED): PAP Smear Comment: 0

## 2017-06-07 ENCOUNTER — Ambulatory Visit (INDEPENDENT_AMBULATORY_CARE_PROVIDER_SITE_OTHER): Payer: BLUE CROSS/BLUE SHIELD | Admitting: Primary Care

## 2017-06-07 ENCOUNTER — Encounter: Payer: Self-pay | Admitting: Primary Care

## 2017-06-07 VITALS — BP 118/76 | HR 76 | Temp 98.1°F | Ht 67.0 in | Wt 230.5 lb

## 2017-06-07 DIAGNOSIS — F329 Major depressive disorder, single episode, unspecified: Secondary | ICD-10-CM

## 2017-06-07 DIAGNOSIS — R229 Localized swelling, mass and lump, unspecified: Secondary | ICD-10-CM | POA: Diagnosis not present

## 2017-06-07 DIAGNOSIS — E669 Obesity, unspecified: Secondary | ICD-10-CM

## 2017-06-07 DIAGNOSIS — F419 Anxiety disorder, unspecified: Secondary | ICD-10-CM

## 2017-06-07 DIAGNOSIS — F32A Depression, unspecified: Secondary | ICD-10-CM

## 2017-06-07 NOTE — Progress Notes (Signed)
Subjective:    Patient ID: Angel Reilly, female    DOB: [birth year], 42 y.o.   MRN: [id number]  HPI  Angel Reilly is a 42 year old female who presents today to establish care and discuss the problems mentioned below. Will obtain old records.   1) Weight Gain: Gradual weight gain over the past 3 years. Lost 23 pounds in Fall 2016 when she stopped drinking large quantities of wine, she then resumed large quantities of wine and regained her weight back. Since then she's been cognizant of alcohol consumption and is now down to one alcoholic beverage weekly. She's also watching calorie consumption and is averaging 1200-1400 calories daily.   She had her thyroid tested several years ago which was within normal range. She attributes her weight gain to lack of exercise and regular consumption of candy.  Diet currently consists of:  Breakfast: Skips sometimes, egg, avocado slices  Lunch: Salad with protein, vinaigrette dressing, [employer] Dinner: Baked salmon, chicken, occasional fast food, little vegetables, salad Snacks: Candy, chips, crackers Desserts: Daily  Beverages: Water, diet coke, alcohol once weekly   Exercise: She is not exercising   2) Skin Mass: Located to the right posterior neck for which she noticed 6 months ago. She denies pain, changes in size, color changes. She is very nervous about the mass and would like further evaluation.   3) Post Partum Depression: Occurred in 2011. Managed on Zoloft in the past with her first occurrence, and then therapy. She did well with therapy, didn't like taking Zoloft. She overall feels better but does experience intermittent symptoms of both depression and anxiety. She is a single mother raising her 63 year old daughter without support from the father or grandparents. She is interested in resuming therapy as it was helpful in the past. She denies SI/HI.  Review of Systems  Respiratory: Negative for shortness of breath.   Cardiovascular:  Negative for chest pain.  Endocrine: Negative for cold intolerance.  Skin: Negative for color change.       Skin mass  Psychiatric/Behavioral:       See HPI       Past Medical History:  Diagnosis Date  . Abnormal Pap smear of cervix   . Pap smear abnormality of cervix/human papillomavirus (HPV) positive   . Post partum depression      Social History   Socioeconomic History  . Marital status: Single    Spouse name: Not on file  . Number of children: Not on file  . Years of education: Not on file  . Highest education level: Not on file  Occupational History  . Not on file  Social Needs  . Financial resource strain: Not on file  . Food insecurity:    Worry: Not on file    Inability: Not on file  . Transportation needs:    Medical: Not on file    Non-medical: Not on file  Tobacco Use  . Smoking status: Never Smoker  . Smokeless tobacco: Never Used  Substance and Sexual Activity  . Alcohol use: No  . Drug use: No  . Sexual activity: Never    Birth control/protection: None  Lifestyle  . Physical activity:    Days per week: Not on file    Minutes per session: Not on file  . Stress: Not on file  Relationships  . Social connections:    Talks on phone: Not on file    Gets together: Not on file    Attends  religious service: Not on file    Active member of club or organization: Not on file    Attends meetings of clubs or organizations: Not on file    Relationship status: Not on file  . Intimate partner violence:    Fear of current or ex partner: Not on file    Emotionally abused: Not on file    Physically abused: Not on file    Forced sexual activity: Not on file  Other Topics Concern  . Not on file  Social History Narrative   Single.   1 child.   Works as an Administrator.     Past Surgical History:  Procedure Laterality Date  . BREAST BIOPSY Left 2000   benign results  . BREAST BIOPSY Left 2018  . DILATION AND CURETTAGE OF UTERUS    . WISDOM TOOTH EXTRACTION       Family History  Problem Relation Age of Onset  . Breast cancer Maternal Aunt        mid 53's  . Colon cancer Maternal Aunt   . Depression Mother   . Hypertension Mother   . Heart disease Father   . Heart attack Father   . Hypertension Father   . Depression Brother   . Heart disease Maternal Grandmother   . Hypertension Maternal Grandmother     No Known Allergies  Current Outpatient Medications on File Prior to Visit  Medication Sig Dispense Refill  . Boric Acid [facility] 600 mg vaginally 2 (two) times a week. 500 g 5   No current facility-administered medications on file prior to visit.     BP 118/76   Pulse 76   Temp 98.1 F (36.7 C) (Oral)   Ht 5\' 7"  (1.702 m)   Wt 230 lb 8 oz (104.6 kg)   LMP 06/04/2017   SpO2 97%   BMI 36.10 kg/m    Objective:   Physical Exam  Constitutional: She appears well-nourished.  Neck: Neck supple.    Cardiovascular: Normal rate and regular rhythm.  Respiratory: Effort normal and breath sounds normal.  Skin: Skin is warm and dry.  1-2 mm circular, subcutaneous, flesh colored, firm mass to right posterior neck.   Psychiatric: She has a normal mood and affect.           Assessment & Plan:  Skin Mass:  Located to right posterior neck x 6 months, no changes. Exam today consistent for likely sebaceous cyst involvement, appears benign. US soft tissue ordered for further evaluation.   Pleas Koch, NP

## 2017-06-07 NOTE — Assessment & Plan Note (Signed)
Suspect weight gain secondary to inactivity and consumption of candy/sweets.   Long discussion today regarding her diet and recommendations for changes provided. Discussed to decrease sweets and start regular physical activity.  Continue calorie counting, consider adding in additional vegetables, fruit.

## 2017-06-07 NOTE — Assessment & Plan Note (Signed)
Intermittent since pregnancy years ago. Overall doing well but some symptoms present. Will send to therapy for further evaluation. Referral placed.

## 2017-06-07 NOTE — Patient Instructions (Addendum)
You will be contacted regarding your referral to therapy and also regarding your ultrasound.  Please let us know if you have not been contacted within one week.   Continue to work on [employer], limit candy.  Start exercising. You should be getting 150 minutes of moderate intensity exercise weekly.  It was a pleasure to meet you today! Please don't hesitate to call or message me with any questions. Welcome to [facility]!

## 2017-06-08 ENCOUNTER — Telehealth: Payer: Self-pay | Admitting: Primary Care

## 2017-06-08 NOTE — Telephone Encounter (Signed)
Tried calling the patient about getting her scheduled for the Soft tissue Ultrasound, voice mail was full and could not leave her a message. Please call Rosaria Ferries at [phone] top get scheduled for Ultrasound.

## 2017-06-16 ENCOUNTER — Encounter: Payer: Self-pay | Admitting: Primary Care

## 2017-09-28 ENCOUNTER — Other Ambulatory Visit (HOSPITAL_COMMUNITY)
Admission: RE | Admit: 2017-09-28 | Discharge: 2017-09-28 | Disposition: A | Discharge status: Home / Self Care | Payer: BLUE CROSS/BLUE SHIELD | Source: Ambulatory Visit | Attending: Primary Care | Admitting: Primary Care

## 2017-09-28 ENCOUNTER — Telehealth: Payer: Self-pay | Admitting: Primary Care

## 2017-09-28 ENCOUNTER — Encounter: Payer: Self-pay | Admitting: Primary Care

## 2017-09-28 ENCOUNTER — Encounter (INDEPENDENT_AMBULATORY_CARE_PROVIDER_SITE_OTHER): Payer: Self-pay

## 2017-09-28 ENCOUNTER — Ambulatory Visit (INDEPENDENT_AMBULATORY_CARE_PROVIDER_SITE_OTHER): Payer: BLUE CROSS/BLUE SHIELD | Admitting: Primary Care

## 2017-09-28 VITALS — BP 118/80 | HR 65 | Temp 98.1°F | Ht 67.0 in | Wt 225.5 lb

## 2017-09-28 DIAGNOSIS — Z124 Encounter for screening for malignant neoplasm of cervix: Secondary | ICD-10-CM | POA: Insufficient documentation

## 2017-09-28 DIAGNOSIS — B9689 Other specified bacterial agents as the cause of diseases classified elsewhere: Secondary | ICD-10-CM | POA: Insufficient documentation

## 2017-09-28 DIAGNOSIS — Z87410 Personal history of cervical dysplasia: Secondary | ICD-10-CM | POA: Diagnosis not present

## 2017-09-28 DIAGNOSIS — N760 Acute vaginitis: Secondary | ICD-10-CM

## 2017-09-28 NOTE — Telephone Encounter (Signed)
Please notify patient that after further record review she had a pap smear in January 2019 which was negative. Does she still wish for [place] to send off her pap smear from today? I don't think it's necessary given that she had a normal pap in January, but if she'd like to proceed then that's fine.  Also does she still have the prescription on file at [facility] for the boric acid? This was prescribed by gynecology during her visit in January.

## 2017-09-28 NOTE — Patient Instructions (Signed)
Stop by the front desk and speak with Rosaria Ferries regarding your referral to ultrasound.  I will be in touch once I receive the pap smear results and vaginal specimen.  It was a pleasure to see you today!

## 2017-09-28 NOTE — Assessment & Plan Note (Signed)
After record review once the patient's visit was complete, Pap smear was done in January 2019 and negative. We will contact patient regarding her pap today to see if she'd still like to proceed. Recommend discarding and waiting until next calendar year as she's had a recent negative pap.

## 2017-09-28 NOTE — Progress Notes (Signed)
Subjective:    Patient ID: Angel Reilly, female    DOB: [birth year], 42 y.o.   MRN: [id number]  HPI  Angel Reilly is a 42 year old female who presents today requesting repeat pap smear. Also would like to check for bacterial vaginosis and yeast.   She's noticed vaginal irritation.   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 She's undergone treatment with various medications including oral antibiotics and vaginal gel without improvement. She had a chronic foul smelling odor to the vagina daily, has to wear panty liners.   She was evaluated by GYN in January 2019, prescribed boric acid for which she was unable to get as pharmacy was out of stock. She'd like to get this filled now.  Review of Systems  Constitutional: Negative for fever.  Gastrointestinal: Negative for abdominal pain.  Genitourinary: Negative for dysuria, pelvic pain and vaginal discharge.       Vaginal odor, vaginal irritation       Past Medical History:  Diagnosis Date  . Abnormal Pap smear of cervix   . Pap smear abnormality of cervix/human papillomavirus (HPV) positive   . Post partum depression      Social History   Socioeconomic History  . Marital status: Single    Spouse name: Not on file  . Number of children: Not on file  . Years of education: Not on file  . Highest education level: Not on file  Occupational History  . Not on file  Social Needs  . Financial resource strain: Not on file  . Food insecurity:    Worry: Not on file    Inability: Not on file  . Transportation needs:    Medical: Not on file    Non-medical: Not on file  Tobacco Use  . Smoking status: Never Smoker  . Smokeless tobacco: Never Used  Substance and Sexual Activity  . Alcohol use: No  . Drug use: No  . Sexual activity: Never    Birth control/protection: None  Lifestyle  . Physical activity:    Days per week: Not on file    Minutes per session: Not on file  . Stress: Not on  file  Relationships  . Social connections:    Talks on phone: Not on file    Gets together: Not on file    Attends religious service: Not on file    Active member of club or organization: Not on file    Attends meetings of clubs or organizations: Not on file    Relationship status: Not on file  . Intimate partner violence:    Fear of current or ex partner: Not on file    Emotionally abused: Not on file    Physically abused: Not on file    Forced sexual activity: Not on file  Other Topics Concern  . Not on file  Social History Narrative   Single.   1 child.   Works as an Administrator.     Past Surgical History:  Procedure Laterality Date  . BREAST BIOPSY Left 2000   benign results  . BREAST BIOPSY Left 2018  . DILATION AND CURETTAGE OF UTERUS    . WISDOM TOOTH EXTRACTION      Family History  Problem Relation Age of Onset  . Breast cancer Maternal Aunt        mid 76's  . Colon cancer Maternal Aunt   . Depression Mother   . Hypertension Mother   .  Heart disease Father   . Heart attack Father   . Hypertension Father   . Depression Brother   . Heart disease Maternal Grandmother   . Hypertension Maternal Grandmother     No Known Allergies  Current Outpatient Medications on File Prior to Visit  Medication Sig Dispense Refill  . Boric Acid [facility] 600 mg vaginally 2 (two) times a week. (Patient not taking: Reported on 09/28/2017) 500 g 5   No current facility-administered medications on file prior to visit.     BP 118/80   Pulse 65   Temp 98.1 F (36.7 C) (Oral)   Ht 5\' 7"  (1.702 m)   Wt 225 lb 8 oz (102.3 kg)   LMP 09/16/2017   SpO2 99%   BMI 35.32 kg/m    Objective:   Physical Exam  Constitutional: She appears well-nourished.  Cardiovascular: Normal rate.  Respiratory: Effort normal.  Genitourinary: There is no tenderness or lesion on the right labia. There is no tenderness or lesion on the left labia. Right adnexum displays no tenderness. Left adnexum  displays no tenderness. No erythema in the vagina. No vaginal discharge found.  Skin: Skin is warm and dry.           Assessment & Plan:

## 2017-09-28 NOTE — Assessment & Plan Note (Signed)
Chronic and recurrent.  Based off record review she saw GYN in January 2019, prescribed boric acid at that time. Also pap smear done which was negative.   Will call patient to see if she still requests pap smear to be sent off as she had one 9 months ago. Will await wet prep results and send boric acid Rx if needed.

## 2017-09-29 ENCOUNTER — Ambulatory Visit
Admission: RE | Admit: 2017-09-29 | Discharge: 2017-09-29 | Disposition: A | Discharge status: Home / Self Care | Payer: BLUE CROSS/BLUE SHIELD | Source: Ambulatory Visit | Attending: Primary Care | Admitting: Primary Care

## 2017-09-29 DIAGNOSIS — R221 Localized swelling, mass and lump, neck: Secondary | ICD-10-CM | POA: Insufficient documentation

## 2017-09-29 DIAGNOSIS — R229 Localized swelling, mass and lump, unspecified: Secondary | ICD-10-CM | POA: Diagnosis present

## 2017-09-29 LAB — WET PREP BY MOLECULAR PROBE
Candida species: NOT DETECTED
Gardnerella vaginalis: NOT DETECTED
MICRO NUMBER:: 91146382
SPECIMEN QUALITY: ADEQUATE
Trichomonas vaginosis: NOT DETECTED

## 2017-09-29 NOTE — Telephone Encounter (Signed)
Spoken and notified patient of Angel Reilly comments on 09/28/2017. Patient stated she wanted to proceed.   Regarding the Rx, patient stated no she does not.

## 2017-09-29 NOTE — Telephone Encounter (Signed)
Noted.  See result note.  

## 2017-09-30 ENCOUNTER — Other Ambulatory Visit: Payer: Self-pay | Admitting: Primary Care

## 2017-09-30 DIAGNOSIS — L723 Sebaceous cyst: Secondary | ICD-10-CM

## 2017-10-01 LAB — CYTOLOGY - PAP
Diagnosis: NEGATIVE
HPV: NOT DETECTED

## 2017-11-24 ENCOUNTER — Telehealth: Payer: Self-pay | Admitting: Primary Care

## 2017-11-24 LAB — HEMOGLOBIN A1C: Hemoglobin A1C: 5.1

## 2017-11-24 LAB — LIPID PANEL
Cholesterol: 147 (ref 0–200)
HDL: 78 — AB (ref 35–70)
LDL Cholesterol: 50
Triglycerides: 44 (ref 40–160)

## 2017-11-24 LAB — BASIC METABOLIC PANEL: Glucose: 99

## 2017-11-24 NOTE — Telephone Encounter (Signed)
Pt called regarding cyst that she had biopsy on. She would like the dermatology referral reopened so she can have removed. It is painful. Prefers [place] location, if possible.

## 2017-11-24 NOTE — Telephone Encounter (Signed)
I placed a referral for her to see Dermatology in late September 2019. Rosaria Ferries, can you take a look?

## 2017-11-25 NOTE — Telephone Encounter (Signed)
Referral re-opened and faxed to [facility] and they will call the patient to schedule, patient notified.

## 2017-11-30 ENCOUNTER — Other Ambulatory Visit: Payer: Self-pay | Admitting: Primary Care

## 2017-11-30 DIAGNOSIS — Z1231 Encounter for screening mammogram for malignant neoplasm of breast: Secondary | ICD-10-CM

## 2017-11-30 NOTE — Telephone Encounter (Signed)
Angel Reilly or [place], can either of you help?

## 2017-12-22 ENCOUNTER — Ambulatory Visit
Admission: RE | Admit: 2017-12-22 | Discharge: 2017-12-22 | Disposition: A | Discharge status: Home / Self Care | Payer: BLUE CROSS/BLUE SHIELD | Source: Ambulatory Visit | Attending: Primary Care | Admitting: Primary Care

## 2017-12-22 DIAGNOSIS — Z1231 Encounter for screening mammogram for malignant neoplasm of breast: Secondary | ICD-10-CM | POA: Diagnosis not present

## 2018-01-26 ENCOUNTER — Ambulatory Visit: Payer: BLUE CROSS/BLUE SHIELD | Admitting: Primary Care

## 2018-02-02 ENCOUNTER — Ambulatory Visit: Payer: BLUE CROSS/BLUE SHIELD | Admitting: Obstetrics and Gynecology

## 2018-02-02 ENCOUNTER — Encounter: Payer: Self-pay | Admitting: Obstetrics and Gynecology

## 2018-02-02 ENCOUNTER — Other Ambulatory Visit (HOSPITAL_COMMUNITY)
Admission: RE | Admit: 2018-02-02 | Discharge: 2018-02-02 | Disposition: A | Discharge status: Home / Self Care | Payer: BLUE CROSS/BLUE SHIELD | Source: Ambulatory Visit | Attending: Obstetrics and Gynecology | Admitting: Obstetrics and Gynecology

## 2018-02-02 VITALS — BP 125/83 | HR 77 | Ht 67.0 in | Wt 225.0 lb

## 2018-02-02 DIAGNOSIS — Z113 Encounter for screening for infections with a predominantly sexual mode of transmission: Secondary | ICD-10-CM | POA: Insufficient documentation

## 2018-02-02 DIAGNOSIS — B9689 Other specified bacterial agents as the cause of diseases classified elsewhere: Secondary | ICD-10-CM

## 2018-02-02 DIAGNOSIS — N760 Acute vaginitis: Secondary | ICD-10-CM | POA: Diagnosis not present

## 2018-02-02 MED ORDER — METRONIDAZOLE 500 MG PO TABS: 500.0000 mg | ORAL_TABLET | Freq: Two times a day (BID) | ORAL | 0 refills | Status: AC

## 2018-02-02 MED ORDER — BORIC ACID CRYS: 600.0000 mg | CRYSTALS | Freq: Every day | 6 refills | Status: DC

## 2018-02-02 NOTE — Progress Notes (Signed)
Obstetrics & Gynecology Office Visit   Chief Complaint  Patient presents with  . vaginal irritation    STD testing/ BV w/discharge and odor   History of Present Illness: [name] is a 43 y.o. [phone] who LMP was Patient's last menstrual period was 01/23/2018 (exact date)., presents today for a problem visit.   Patient complains of an abnormal vaginal discharge for 3 weeks. Discharge described as: white, thin and watery. Vaginal symptoms include odor.  Vulvar symptoms include none.STI Risk: Possible STD exposure.   Other associated symptoms: none.Menstrual pattern: She had been bleeding regularly. Contraception: none.  She denies recent antibiotic exposure, reports changes in soaps, detergents coinciding with the onset of her symptoms.  She has not previously self treated or been under treatment by another provider for these symptoms.    She reports that she has been abstinent for a while and is newly sexually active again and just wants to be tested.   Past Medical History:  Diagnosis Date  . Abnormal Pap smear of cervix   . Pap smear abnormality of cervix/human papillomavirus (HPV) positive   . Post partum depression     Past Surgical History:  Procedure Laterality Date  . BREAST BIOPSY Left 2000   benign results  . BREAST BIOPSY Left 2018   us/ bx/clip-neg fibradenoma  . DILATION AND CURETTAGE OF UTERUS    . WISDOM TOOTH EXTRACTION      Gynecologic History: Patient's last menstrual period was 01/23/2018 (exact date).  Obstetric History: [id number]  Family History  Problem Relation Age of Onset  . Breast cancer Maternal Aunt        mid 84's  . Colon cancer Maternal Aunt   . Depression Mother   . Hypertension Mother   . Heart disease Father   . Heart attack Father   . Hypertension Father   . Depression Brother   . Heart disease Maternal Grandmother   . Hypertension Maternal Grandmother     Social History   Socioeconomic History  . Marital status: Single     Spouse name: Not on file  . Number of children: Not on file  . Years of education: Not on file  . Highest education level: Not on file  Occupational History  . Not on file  Social Needs  . Financial resource strain: Not on file  . Food insecurity:    Worry: Not on file    Inability: Not on file  . Transportation needs:    Medical: Not on file    Non-medical: Not on file  Tobacco Use  . Smoking status: Never Smoker  . Smokeless tobacco: Never Used  Substance and Sexual Activity  . Alcohol use: No  . Drug use: No  . Sexual activity: Never    Birth control/protection: None  Lifestyle  . Physical activity:    Days per week: Not on file    Minutes per session: Not on file  . Stress: Not on file  Relationships  . Social connections:    Talks on phone: Not on file    Gets together: Not on file    Attends religious service: Not on file    Active member of club or organization: Not on file    Attends meetings of clubs or organizations: Not on file    Relationship status: Not on file  . Intimate partner violence:    Fear of current or ex partner: Not on file    Emotionally abused: Not on file  Physically abused: Not on file    Forced sexual activity: Not on file  Other Topics Concern  . Not on file  Social History Narrative   Single.   1 child.   Works as an Administrator.     No Known Allergies  Prior to Admission medications   Medication Sig Start Date End Date Taking? Authorizing Provider  Boric Acid [facility] 600 mg vaginally 2 (two) times a week. Patient not taking: Reported on 09/28/2017 01/18/17   Gae Dry, MD    Review of Systems  Constitutional: Negative.   HENT: Negative.   Eyes: Negative.   Respiratory: Negative.   Cardiovascular: Negative.   Gastrointestinal: Negative.   Genitourinary: Negative.   Musculoskeletal: Negative.   Skin: Negative.   Neurological: Negative.   Psychiatric/Behavioral: Negative.      Physical Exam BP 125/83 (BP  Location: Left Arm, Patient Position: Sitting, Cuff Size: Large)   Pulse 77   Ht 5\' 7"  (1.702 m)   Wt 225 lb (102.1 kg)   LMP 01/23/2018 (Exact Date)   BMI 35.24 kg/m  Patient's last menstrual period was 01/23/2018 (exact date). Physical Exam Constitutional:      General: She is not in acute distress.    Appearance: Normal appearance. She is well-developed.  Genitourinary:     Pelvic exam was performed with patient supine.     Vulva, inguinal canal, urethra, bladder, vagina, uterus, right adnexa and left adnexa normal.     No posterior fourchette tenderness, injury or lesion present.     No cervical friability, lesion, bleeding or polyp.  HENT:     Head: Normocephalic and atraumatic.  Eyes:     General: No scleral icterus.    Conjunctiva/sclera: Conjunctivae normal.  Neck:     Musculoskeletal: Normal range of motion and neck supple.  Cardiovascular:     Rate and Rhythm: Normal rate and regular rhythm.     Heart sounds: No murmur. No friction rub. No gallop.   Pulmonary:     Effort: Pulmonary effort is normal. No respiratory distress.     Breath sounds: Normal breath sounds. No wheezing or rales.  Abdominal:     General: Bowel sounds are normal. There is no distension.     Palpations: Abdomen is soft. There is no mass.     Tenderness: There is no abdominal tenderness. There is no guarding or rebound.  Musculoskeletal: Normal range of motion.  Neurological:     General: No focal deficit present.     Mental Status: She is alert and oriented to person, place, and time.     Cranial Nerves: No cranial nerve deficit.  Skin:    General: Skin is warm and dry.     Findings: No erythema.  Psychiatric:        Mood and Affect: Mood normal.        Behavior: Behavior normal.        Judgment: Judgment normal.   Wet Prep: PH: 5.0 Clue Cells: Positive Fungal elements: Negative Trichomonas: Negative   Female chaperone present for pelvic and breast  portions of the physical  exam  Assessment: 43 y.o. [id number] female here for  1. Bacterial vaginosis   2. Screen for STD (sexually transmitted disease)      Plan: Problem List Items Addressed This Visit      Genitourinary   Bacterial vaginosis - Primary   Relevant Medications   metroNIDAZOLE (FLAGYL) 500 MG tablet   Boric Acid [facility]  Other Visit Diagnoses    Screen for STD (sexually transmitted disease)       Relevant Orders   Cervicovaginal ancillary only     Will treat with induction therapy for BV, then follow x 1 month with boric acid.  STD testing ordered per her request.   Prentice Docker, MD 02/02/2018 5:42 PM

## 2018-02-03 LAB — CERVICOVAGINAL ANCILLARY ONLY
Chlamydia: NEGATIVE
Neisseria Gonorrhea: NEGATIVE
Trichomonas: NEGATIVE

## 2018-02-04 ENCOUNTER — Ambulatory Visit: Payer: BLUE CROSS/BLUE SHIELD | Admitting: Primary Care

## 2018-02-09 ENCOUNTER — Ambulatory Visit: Payer: BLUE CROSS/BLUE SHIELD | Admitting: Obstetrics & Gynecology

## 2018-02-16 ENCOUNTER — Encounter: Payer: BLUE CROSS/BLUE SHIELD | Admitting: Primary Care

## 2018-02-16 DIAGNOSIS — Z0289 Encounter for other administrative examinations: Secondary | ICD-10-CM

## 2018-02-22 ENCOUNTER — Ambulatory Visit: Payer: BLUE CROSS/BLUE SHIELD | Admitting: Family Medicine

## 2018-02-22 ENCOUNTER — Encounter: Payer: Self-pay | Admitting: Family Medicine

## 2018-02-22 VITALS — BP 124/78 | HR 74 | Temp 98.6°F | Resp 14 | Ht 67.0 in | Wt 228.0 lb

## 2018-02-22 DIAGNOSIS — J0140 Acute pansinusitis, unspecified: Secondary | ICD-10-CM

## 2018-02-22 MED ORDER — FLUCONAZOLE 150 MG PO TABS: 150.0000 mg | ORAL_TABLET | Freq: Once | ORAL | 0 refills | Status: AC

## 2018-02-22 MED ORDER — AMOXICILLIN-POT CLAVULANATE 875-125 MG PO TABS: 1.0000 | ORAL_TABLET | Freq: Two times a day (BID) | ORAL | 0 refills | Status: DC

## 2018-02-22 NOTE — Patient Instructions (Signed)
   1. Drink plenty of fluids 2. Get lots of rest  Sinus Congestion 1) Saline spray 3-4 times a day 2) Flonase (Store Brand ok) - once daily 3) Over the counter congestion medications  Cough 1) Cough drops can be helpful 2) Nyquil (or nighttime cough medication) 3) Honey is proven to be one of the best cough medications   Sore Throat 1) Honey as above, cough drops 2) Ibuprofen or Aleve can be helpful 3) Salt water Gargles  If you develop fevers (Temperature >100.4), chills, worsening symptoms or symptoms lasting longer than 10 days return to clinic.

## 2018-02-22 NOTE — Progress Notes (Signed)
Subjective:     Angel Reilly is a 43 y.o. female presenting for Sore Throat (Symptoms started about 3 days ago. Chest congestion, cough, headache. No body aches. No fever. Has taking Robitussin.)     Sore Throat   This is a recurrent problem. The current episode started 1 to 4 weeks ago. The problem has been gradually worsening. There has been no fever. Associated symptoms include congestion, coughing, headaches, shortness of breath and trouble swallowing. Pertinent negatives include no abdominal pain, diarrhea, ear pain, plugged ear sensation or vomiting. Treatments tried: tea, robitussin, cough drops, gargle.   Daughter had flu - then she started getting sick on 02/13/2018  Review of Systems  HENT: Positive for congestion, sinus pressure, sinus pain, sore throat and trouble swallowing. Negative for ear pain, postnasal drip and rhinorrhea.   Respiratory: Positive for cough and shortness of breath.   Gastrointestinal: Negative for abdominal pain, diarrhea and vomiting.  Neurological: Positive for headaches.     Social History   Tobacco Use  Smoking Status Never Smoker  Smokeless Tobacco Never Used        Objective:    BP Readings from Last 3 Encounters:  02/22/18 124/78  02/02/18 125/83  09/28/17 118/80   Wt Readings from Last 3 Encounters:  02/22/18 228 lb (103.4 kg)  02/02/18 225 lb (102.1 kg)  09/28/17 225 lb 8 oz (102.3 kg)    BP 124/78   Pulse 74   Temp 98.6 F (37 C)   Resp 14   Ht 5\' 7"  (1.702 m)   Wt 228 lb (103.4 kg)   LMP 02/18/2018   SpO2 97%   BMI 35.71 kg/m    Physical Exam Constitutional:      General: She is not in acute distress.    Appearance: She is well-developed. She is not diaphoretic.  HENT:     Head: Normocephalic and atraumatic.     Right Ear: Tympanic membrane and ear canal normal.     Left Ear: Tympanic membrane and ear canal normal.     Nose: Mucosal edema and rhinorrhea present.     Right Sinus: No maxillary sinus  tenderness or frontal sinus tenderness.     Left Sinus: No maxillary sinus tenderness or frontal sinus tenderness.     Mouth/Throat:     Pharynx: Uvula midline. Posterior oropharyngeal erythema present. No oropharyngeal exudate.     Tonsils: Swelling: 1+ on the right. 1+ on the left.  Eyes:     General: No scleral icterus.    Conjunctiva/sclera: Conjunctivae normal.  Neck:     Musculoskeletal: Neck supple.  Cardiovascular:     Rate and Rhythm: Normal rate and regular rhythm.     Heart sounds: Normal heart sounds. No murmur.  Pulmonary:     Effort: Pulmonary effort is normal. No respiratory distress.     Breath sounds: Wheezing (scattered) present.  Lymphadenopathy:     Cervical: No cervical adenopathy.  Skin:    General: Skin is warm and dry.     Capillary Refill: Capillary refill takes less than 2 seconds.  Neurological:     General: No focal deficit present.     Mental Status: She is alert.  Psychiatric:        Mood and Affect: Mood normal.           Assessment & Plan:   Problem List Items Addressed This Visit    None    Visit Diagnoses    Acute non-recurrent pansinusitis    -  Primary   Relevant Medications   amoxicillin-clavulanate (AUGMENTIN) 875-125 MG tablet   fluconazole (DIFLUCAN) 150 MG tablet     Given that symptoms were improving then worsened suspect sinus infection. Abx and symptomatic care  Fluconazole if needed for yeast infection   Return if symptoms worsen or fail to improve.  Lesleigh Noe, MD

## 2018-02-28 ENCOUNTER — Encounter: Payer: Self-pay | Admitting: Internal Medicine

## 2018-02-28 ENCOUNTER — Ambulatory Visit (INDEPENDENT_AMBULATORY_CARE_PROVIDER_SITE_OTHER): Payer: BLUE CROSS/BLUE SHIELD | Admitting: Internal Medicine

## 2018-02-28 VITALS — BP 110/70 | HR 65 | Temp 97.7°F | Resp 18 | Ht 67.0 in | Wt 228.0 lb

## 2018-02-28 DIAGNOSIS — R05 Cough: Secondary | ICD-10-CM

## 2018-02-28 DIAGNOSIS — R059 Cough, unspecified: Secondary | ICD-10-CM

## 2018-02-28 MED ORDER — BENZONATATE 200 MG PO CAPS: 200.0000 mg | ORAL_CAPSULE | Freq: Three times a day (TID) | ORAL | 0 refills | Status: DC | PRN

## 2018-02-28 MED ORDER — DOXYCYCLINE HYCLATE 100 MG PO TABS: 100.0000 mg | ORAL_TABLET | Freq: Two times a day (BID) | ORAL | 0 refills | Status: DC

## 2018-02-28 NOTE — Patient Instructions (Signed)
Try the over the counter DM (dextromethorphan) along with the prescription cough medications.

## 2018-02-28 NOTE — Assessment & Plan Note (Addendum)
Severe and persistent Has dyspnea as well Will check CXR---no available right now  Will change to doxy Add benzonatate Check CXR if not improving in the next couple of days

## 2018-02-28 NOTE — Progress Notes (Signed)
Subjective:    Patient ID: Angel Reilly, female    DOB: November 11, 1975, 43 y.o.   MRN: [id number]  HPI Here due to persistent respiratory symptoms  Has been sick for 2 weeks Coughing---"very winded"---getting SOB just walking a short distance Has been on antibiotic --but no better  No fever No chills or sweats Cough with occasional sputum (clear or yellow tinged----occ darker)  No sig head symptoms Some sore throat still No ear pain  Tried alka seltzer last night--helped her sleep a little  Current Outpatient Medications on File Prior to Visit  Medication Sig Dispense Refill  . amoxicillin-clavulanate (AUGMENTIN) 875-125 MG tablet Take 1 tablet by mouth 2 (two) times daily for 7 days. 14 tablet 0   No current facility-administered medications on file prior to visit.     No Known Allergies  Past Medical History:  Diagnosis Date  . Abnormal Pap smear of cervix   . Pap smear abnormality of cervix/human papillomavirus (HPV) positive   . Post partum depression     Past Surgical History:  Procedure Laterality Date  . BREAST BIOPSY Left 2000   benign results  . BREAST BIOPSY Left 2018   us/ bx/clip-neg fibradenoma  . DILATION AND CURETTAGE OF UTERUS    . WISDOM TOOTH EXTRACTION      Family History  Problem Relation Age of Onset  . Breast cancer Maternal Aunt        mid 61's  . Colon cancer Maternal Aunt   . Depression Mother   . Hypertension Mother   . Heart disease Father   . Heart attack Father   . Hypertension Father   . Depression Brother   . Heart disease Maternal Grandmother   . Hypertension Maternal Grandmother     Social History   Socioeconomic History  . Marital status: Single    Spouse name: Not on file  . Number of children: Not on file  . Years of education: Not on file  . Highest education level: Not on file  Occupational History  . Not on file  Social Needs  . Financial resource strain: Not on file  . Food insecurity:    Worry: Not  on file    Inability: Not on file  . Transportation needs:    Medical: Not on file    Non-medical: Not on file  Tobacco Use  . Smoking status: Never Smoker  . Smokeless tobacco: Never Used  Substance and Sexual Activity  . Alcohol use: No  . Drug use: No  . Sexual activity: Never    Birth control/protection: None  Lifestyle  . Physical activity:    Days per week: Not on file    Minutes per session: Not on file  . Stress: Not on file  Relationships  . Social connections:    Talks on phone: Not on file    Gets together: Not on file    Attends religious service: Not on file    Active member of club or organization: Not on file    Attends meetings of clubs or organizations: Not on file    Relationship status: Not on file  . Intimate partner violence:    Fear of current or ex partner: Not on file    Emotionally abused: Not on file    Physically abused: Not on file    Forced sexual activity: Not on file  Other Topics Concern  . Not on file  Social History Narrative   Single.   1 child.  Works as an Administrator.    Review of Systems  No rash No vomiting or diarrhea Appetite is okay     Objective:   Physical Exam  Constitutional: She appears well-developed. No distress.  Frequent coarse cough  HENT:  No sinus tenderness Some nasal inflammation with green nasal discharge Pharynx without injection or exudates  Neck: No thyromegaly present.  Respiratory: Effort normal and breath sounds normal. No respiratory distress. She has no wheezes. She has no rales.  Lymphadenopathy:    She has no cervical adenopathy.           Assessment & Plan:

## 2018-03-13 ENCOUNTER — Encounter (HOSPITAL_BASED_OUTPATIENT_CLINIC_OR_DEPARTMENT_OTHER): Payer: Self-pay | Admitting: Emergency Medicine

## 2018-03-13 ENCOUNTER — Other Ambulatory Visit: Payer: Self-pay

## 2018-03-13 ENCOUNTER — Emergency Department (HOSPITAL_BASED_OUTPATIENT_CLINIC_OR_DEPARTMENT_OTHER)
Admission: EM | Admit: 2018-03-13 | Discharge: 2018-03-13 | Disposition: A | Discharge status: Home / Self Care | Payer: BLUE CROSS/BLUE SHIELD | Attending: Emergency Medicine | Admitting: Emergency Medicine

## 2018-03-13 DIAGNOSIS — Z5321 Procedure and treatment not carried out due to patient leaving prior to being seen by health care provider: Secondary | ICD-10-CM | POA: Diagnosis not present

## 2018-03-13 DIAGNOSIS — R220 Localized swelling, mass and lump, head: Secondary | ICD-10-CM | POA: Diagnosis not present

## 2018-03-13 NOTE — ED Triage Notes (Signed)
Patient states that she had pizza about 2 -3 hours ago and she thinks her bilateral eye swelling is a reaction to the mushrooms. Patient has noted bilateral eye lid swelling  - denies any pain or SOB  - no distress noted

## 2018-03-13 NOTE — ED Notes (Signed)
Called x 3 no answer 

## 2018-03-13 NOTE — ED Notes (Signed)
Called for room no answer

## 2018-03-25 ENCOUNTER — Ambulatory Visit: Payer: Self-pay | Admitting: Internal Medicine

## 2018-04-22 ENCOUNTER — Ambulatory Visit: Payer: BLUE CROSS/BLUE SHIELD | Admitting: Obstetrics & Gynecology

## 2018-04-27 ENCOUNTER — Ambulatory Visit: Payer: Self-pay | Admitting: Internal Medicine

## 2018-05-03 ENCOUNTER — Encounter: Payer: Self-pay | Admitting: Internal Medicine

## 2018-05-03 ENCOUNTER — Ambulatory Visit: Payer: Self-pay | Admitting: Internal Medicine

## 2018-05-03 DIAGNOSIS — R87610 Atypical squamous cells of undetermined significance on cytologic smear of cervix (ASC-US): Secondary | ICD-10-CM

## 2018-06-14 ENCOUNTER — Ambulatory Visit (INDEPENDENT_AMBULATORY_CARE_PROVIDER_SITE_OTHER): Payer: BLUE CROSS/BLUE SHIELD | Admitting: Obstetrics & Gynecology

## 2018-06-14 ENCOUNTER — Encounter: Payer: Self-pay | Admitting: Obstetrics & Gynecology

## 2018-06-14 ENCOUNTER — Other Ambulatory Visit: Payer: Self-pay

## 2018-06-14 ENCOUNTER — Other Ambulatory Visit (HOSPITAL_COMMUNITY)
Admission: RE | Admit: 2018-06-14 | Discharge: 2018-06-14 | Disposition: A | Discharge status: Home / Self Care | Payer: BLUE CROSS/BLUE SHIELD | Source: Ambulatory Visit | Attending: Obstetrics & Gynecology | Admitting: Obstetrics & Gynecology

## 2018-06-14 VITALS — BP 110/70 | Ht 67.0 in | Wt 226.0 lb

## 2018-06-14 DIAGNOSIS — Z01419 Encounter for gynecological examination (general) (routine) without abnormal findings: Secondary | ICD-10-CM | POA: Diagnosis not present

## 2018-06-14 DIAGNOSIS — R87610 Atypical squamous cells of undetermined significance on cytologic smear of cervix (ASC-US): Secondary | ICD-10-CM | POA: Diagnosis not present

## 2018-06-14 DIAGNOSIS — R87810 Cervical high risk human papillomavirus (HPV) DNA test positive: Secondary | ICD-10-CM | POA: Insufficient documentation

## 2018-06-14 LAB — HM PAP SMEAR

## 2018-06-14 NOTE — Progress Notes (Signed)
HPI:      [name] is a 43 y.o. [phone] who LMP was Patient's last menstrual period was 06/06/2018., she presents today for her annual examination. The patient has no complaints today. The patient is not currently sexually active. Her last pap: was normal and prior PAP was ASCUS, Pos HPV and last mammogram: approximate date 2019 and was normal. The patient does perform self breast exams.  There is no notable family history of breast or ovarian cancer in her family.  The patient has regular exercise: yes.  The patient denies current symptoms of depression.  Vegan diet for past 40 days, doing well w increased energy, some weight loss.  Periods range from every 21 to 28 days.  No recent BV sx's (behavioral modifications, no meds at this time), prior h/o recurrent BV.   GYN History: Contraception: abstinence  PMHx: Past Medical History:  Diagnosis Date  . Abnormal Pap smear of cervix   . History of cervical dysplasia 01/15/2017  . Pap smear abnormality of cervix/human papillomavirus (HPV) positive   . Post partum depression    Past Surgical History:  Procedure Laterality Date  . BREAST BIOPSY Left 2000   benign results  . BREAST BIOPSY Left 2018   us/ bx/clip-neg fibradenoma  . DILATION AND CURETTAGE OF UTERUS    . WISDOM TOOTH EXTRACTION     Family History  Problem Relation Age of Onset  . Breast cancer Maternal Aunt        mid 49's  . Colon cancer Maternal Aunt   . Depression Mother   . Hypertension Mother   . Heart disease Father   . Heart attack Father   . Hypertension Father   . Depression Brother   . Heart disease Maternal Grandmother   . Hypertension Maternal Grandmother    Social History   Tobacco Use  . Smoking status: Never Smoker  . Smokeless tobacco: Never Used  Substance Use Topics  . Alcohol use: No  . Drug use: No   No current outpatient medications on file. Allergies: Patient has no known allergies.  Review of Systems  Constitutional:  Positive for malaise/fatigue. Negative for chills and fever.  HENT: Negative for congestion, sinus pain and sore throat.   Eyes: Negative for blurred vision and pain.  Respiratory: Negative for cough and wheezing.   Cardiovascular: Negative for chest pain and leg swelling.  Gastrointestinal: Negative for abdominal pain, constipation, diarrhea, heartburn, nausea and vomiting.  Genitourinary: Negative for dysuria, frequency, hematuria and urgency.  Musculoskeletal: Negative for back pain, joint pain, myalgias and neck pain.  Skin: Negative for itching and rash.  Neurological: Negative for dizziness, tremors and weakness.  Endo/Heme/Allergies: Does not bruise/bleed easily.  Psychiatric/Behavioral: Negative for depression. The patient is not nervous/anxious and does not have insomnia.     Objective: BP 110/70   Ht 5\' 7"  (1.702 m)   Wt 226 lb (102.5 kg)   LMP 06/06/2018   BMI 35.40 kg/m   Filed Weights   06/14/18 1414  Weight: 226 lb (102.5 kg)   Body mass index is 35.4 kg/m. Physical Exam Constitutional:      General: She is not in acute distress.    Appearance: She is well-developed.  Genitourinary:     Pelvic exam was performed with patient supine.     Vagina, uterus and rectum normal.     No lesions in the vagina.     No vaginal bleeding.     No cervical motion tenderness,  friability, lesion or polyp.     Uterus is mobile.     Uterus is not enlarged.     No uterine mass detected.    Uterus is midaxial.     No right or left adnexal mass present.     Right adnexa not tender.     Left adnexa not tender.  HENT:     Head: Normocephalic and atraumatic. No laceration.     Right Ear: Hearing normal.     Left Ear: Hearing normal.     Mouth/Throat:     Pharynx: Uvula midline.  Eyes:     Pupils: Pupils are equal, round, and reactive to light.  Neck:     Musculoskeletal: Normal range of motion and neck supple.     Thyroid: No thyromegaly.  Cardiovascular:     Rate and  Rhythm: Normal rate and regular rhythm.     Heart sounds: No murmur. No friction rub. No gallop.   Pulmonary:     Effort: Pulmonary effort is normal. No respiratory distress.     Breath sounds: Normal breath sounds. No wheezing.  Chest:     Breasts:        Right: No mass, skin change or tenderness.        Left: No mass, skin change or tenderness.  Abdominal:     General: Bowel sounds are normal. There is no distension.     Palpations: Abdomen is soft.     Tenderness: There is no abdominal tenderness. There is no rebound.  Musculoskeletal: Normal range of motion.  Neurological:     Mental Status: She is alert and oriented to person, place, and time.     Cranial Nerves: No cranial nerve deficit.  Skin:    General: Skin is warm and dry.  Psychiatric:        Judgment: Judgment normal.  Vitals signs reviewed.     Assessment:  ANNUAL EXAM 1. Women's annual routine gynecological examination   2. ASCUS with positive high risk HPV cervical      Screening Plan:            1.  Cervical Screening-  Pap smear done today  2. Breast screening- Exam annually and mammogram>40 planned   3. Colonoscopy every 10 years, Hemoccult testing - after age 73  4. Labs managed by PCP  5. Counseling for contraception: abstinence     F/U  Return in about 1 year (around 06/14/2019) for Annual.  Barnett Applebaum, MD, [facility], [facility] 06/14/2018  2:32 PM

## 2018-06-14 NOTE — Patient Instructions (Addendum)
PAP every year Mammogram every year    Call [phone] to schedule at [facility], due in Dec 2020 Labs yearly (with PCP)

## 2018-06-21 ENCOUNTER — Encounter: Payer: Self-pay | Admitting: Obstetrics & Gynecology

## 2018-06-21 LAB — CYTOLOGY - PAP
Chlamydia: NEGATIVE
Diagnosis: UNDETERMINED — AB
HPV: DETECTED — AB
Neisseria Gonorrhea: NEGATIVE
Trichomonas: NEGATIVE

## 2018-06-21 NOTE — Telephone Encounter (Signed)
Pt calling; wants to speak c [place] not a nurse about her pap smear.  [phone]

## 2018-06-22 ENCOUNTER — Telehealth: Payer: Self-pay | Admitting: Obstetrics & Gynecology

## 2018-06-22 NOTE — Telephone Encounter (Signed)
Patient is calling for labs results. Please advise. 

## 2018-06-22 NOTE — Telephone Encounter (Signed)
Sch Colpo w PH  Discussed ASCUS w HPV results w pt; pros and cons of colposcopy. She has prior h/o ASCUS but many years ago. Last Colpo was prior to 2015 (and no biopsy then).  Barnett Applebaum, MD, [facility], [facility] 06/22/2018  1:34 PM

## 2018-06-22 NOTE — Telephone Encounter (Signed)
Patient is schedule at first available appointment on 07/12/18

## 2018-07-12 ENCOUNTER — Ambulatory Visit: Payer: BLUE CROSS/BLUE SHIELD | Admitting: Obstetrics & Gynecology

## 2018-10-17 ENCOUNTER — Other Ambulatory Visit: Payer: Self-pay | Admitting: Primary Care

## 2018-10-17 DIAGNOSIS — Z0000 Encounter for general adult medical examination without abnormal findings: Secondary | ICD-10-CM

## 2018-10-20 ENCOUNTER — Other Ambulatory Visit: Payer: BLUE CROSS/BLUE SHIELD

## 2018-10-20 ENCOUNTER — Other Ambulatory Visit (INDEPENDENT_AMBULATORY_CARE_PROVIDER_SITE_OTHER): Payer: BLUE CROSS/BLUE SHIELD

## 2018-10-20 DIAGNOSIS — Z0000 Encounter for general adult medical examination without abnormal findings: Secondary | ICD-10-CM

## 2018-10-21 LAB — LIPID PANEL
Cholesterol: 120 mg/dL (ref 0–200)
HDL: 52.3 mg/dL (ref >39.00)
LDL Cholesterol: 60 mg/dL (ref 0–99)
NonHDL: 67.82
Total CHOL/HDL Ratio: 2
Triglycerides: 37 mg/dL (ref 0.0–149.0)
VLDL: 7.4 mg/dL (ref 0.0–40.0)

## 2018-10-21 LAB — COMPREHENSIVE METABOLIC PANEL
ALT: 10 U/L (ref 0–35)
AST: 13 U/L (ref 0–37)
Albumin: 4.5 g/dL (ref 3.5–5.2)
Alkaline Phosphatase: 51 U/L (ref 39–117)
BUN: 11 mg/dL (ref 6–23)
CO2: 27 mEq/L (ref 19–32)
Calcium: 9.4 mg/dL (ref 8.4–10.5)
Chloride: 105 mEq/L (ref 96–112)
Creatinine, Ser: 0.91 mg/dL (ref 0.40–1.20)
GFR: 82.51 mL/min (ref >60.00)
Glucose, Bld: 77 mg/dL (ref 70–99)
Potassium: 3.8 mEq/L (ref 3.5–5.1)
Sodium: 139 mEq/L (ref 135–145)
Total Bilirubin: 0.8 mg/dL (ref 0.2–1.2)
Total Protein: 7.7 g/dL (ref 6.0–8.3)

## 2018-10-26 ENCOUNTER — Encounter: Payer: BLUE CROSS/BLUE SHIELD | Admitting: Primary Care

## 2018-10-26 ENCOUNTER — Telehealth: Payer: Self-pay

## 2018-10-26 NOTE — Telephone Encounter (Signed)
Called to confirm cancellation with patient and reschedule, but patient didn't answer. I went ahead and cancelled appt.

## 2018-10-26 NOTE — Telephone Encounter (Signed)
[facility] Night - Client Nonclinical Telephone Record AccessNurse Client [name] Night - Client Client Site [place] Physician Alma Friendly - NP Contact Type Call Who Is Calling Patient / Member / Family / Caregiver Caller Name Angel Reilly Phone Number [phone] Patient Name Angel Reilly Patient DOB 1975/10/25 Call Type Message Only Information Provided Reason for Call Request to Welcome Appointment Initial Comment Caller states she has an appointment today, but she needs to cancel it, because her childcare fail through. Additional Comment Office hours provided. Call Closed By: Dulcy Fanny Transaction Date/Time: 10/26/2018 8:00:07 AM (ET)

## 2018-12-05 ENCOUNTER — Other Ambulatory Visit: Payer: Self-pay | Admitting: Obstetrics & Gynecology

## 2018-12-05 DIAGNOSIS — Z1231 Encounter for screening mammogram for malignant neoplasm of breast: Secondary | ICD-10-CM

## 2018-12-07 ENCOUNTER — Ambulatory Visit (INDEPENDENT_AMBULATORY_CARE_PROVIDER_SITE_OTHER): Payer: BLUE CROSS/BLUE SHIELD | Admitting: Primary Care

## 2018-12-07 ENCOUNTER — Other Ambulatory Visit: Payer: Self-pay

## 2018-12-07 VITALS — BP 120/78 | HR 76 | Temp 97.7°F | Ht 67.0 in | Wt 226.5 lb

## 2018-12-07 DIAGNOSIS — R87610 Atypical squamous cells of undetermined significance on cytologic smear of cervix (ASC-US): Secondary | ICD-10-CM | POA: Diagnosis not present

## 2018-12-07 DIAGNOSIS — N898 Other specified noninflammatory disorders of vagina: Secondary | ICD-10-CM | POA: Diagnosis not present

## 2018-12-07 DIAGNOSIS — Z113 Encounter for screening for infections with a predominantly sexual mode of transmission: Secondary | ICD-10-CM

## 2018-12-07 DIAGNOSIS — R229 Localized swelling, mass and lump, unspecified: Secondary | ICD-10-CM | POA: Diagnosis not present

## 2018-12-07 NOTE — Assessment & Plan Note (Signed)
Appears to be sebaceous cyst. Referral placed to dermatology per patient request.

## 2018-12-07 NOTE — Progress Notes (Signed)
Subjective:    Patient ID: Angel Reilly, female    DOB: [birth year], 43 y.o.   MRN: [id number]  HPI  Angel Reilly is a 43 year old female with a history of ASC-US HR HPV+ who presents today with a chief complaint of chronic vaginal discharge. She would also like STD testing and dermatology referral for recurrent skin mass.  Currently following with [facility] through [facility] for recurrent abnormal pap smears. She underwent cervical pap smear/HPV in November 2020, endorses this was stable.   She denies vaginal itching, hematuria, urinary symptoms, pelvic pain. She's been treated recurrently over the years for BV, has been told within the last several years that she didn't need treatment for BV. She would like vaginal swabs for BV/Yeast and STD testing completed today.  She has a recurrent mass to the base of her right posterior neck for the last two years. One year ago the mass spontaneously started draining and disappeared. Since March 2020 she's noticed a return in the mass with gradual growth. She denies pain, erythema.   Review of Systems  Genitourinary: Negative for dysuria, flank pain, frequency, hematuria and pelvic pain.       Chronic vaginal discharge, unchanged  Skin: Negative for color change.       Skin mass       Past Medical History:  Diagnosis Date  . Abnormal Pap smear of cervix   . History of cervical dysplasia 01/15/2017  . Pap smear abnormality of cervix/human papillomavirus (HPV) positive   . Post partum depression      Social History   Socioeconomic History  . Marital status: Single    Spouse name: Not on file  . Number of children: 1  . Years of education: Not on file  . Highest education level: Not on file  Occupational History  . Not on file  Social Needs  . Financial resource strain: Not on file  . Food insecurity    Worry: Not on file    Inability: Not on file  . Transportation needs    Medical: Not on file    Non-medical: Not on file   Tobacco Use  . Smoking status: Never Smoker  . Smokeless tobacco: Never Used  Substance and Sexual Activity  . Alcohol use: No  . Drug use: No  . Sexual activity: Yes    Birth control/protection: None  Lifestyle  . Physical activity    Days per week: Not on file    Minutes per session: Not on file  . Stress: Not on file  Relationships  . Social Herbalist on phone: Not on file    Gets together: Not on file    Attends religious service: Not on file    Active member of club or organization: Not on file    Attends meetings of clubs or organizations: Not on file    Relationship status: Not on file  . Intimate partner violence    Fear of current or ex partner: Not on file    Emotionally abused: Not on file    Physically abused: Not on file    Forced sexual activity: Not on file  Other Topics Concern  . Not on file  Social History Narrative   Single.   1 child.   Works as an Administrator.     Past Surgical History:  Procedure Laterality Date  . BREAST BIOPSY Left 2000   benign results  . BREAST BIOPSY Left 2018  us/ bx/clip-neg fibradenoma  . DILATION AND CURETTAGE OF UTERUS    . WISDOM TOOTH EXTRACTION      Family History  Problem Relation Age of Onset  . Breast cancer Maternal Aunt        mid 14's  . Colon cancer Maternal Aunt   . Depression Mother   . Hypertension Mother   . Heart disease Father   . Heart attack Father   . Hypertension Father   . Depression Brother   . Heart disease Maternal Grandmother   . Hypertension Maternal Grandmother     No Known Allergies  No current outpatient medications on file prior to visit.   No current facility-administered medications on file prior to visit.     BP 120/78   Pulse 76   Temp 97.7 F (36.5 C) (Temporal)   Ht 5\' 7"  (1.702 m)   Wt 226 lb 8 oz (102.7 kg)   LMP 11/14/2018   SpO2 98%   BMI 35.47 kg/m    Objective:   Physical Exam  Constitutional: She appears well-nourished.  Cardiovascular:  Normal rate.  Respiratory: Effort normal.  Genitourinary: There is no tenderness or lesion on the right labia. There is no tenderness on the left labia. Cervix exhibits discharge. Cervix exhibits no motion tenderness.    No vaginal discharge or erythema.  No erythema in the vagina.    Genitourinary Comments: Scant amount of whitish cervical discharge without foul smell.    Skin: Skin is warm and dry.  0.5 rounded, soft, immobile, dark brown mass to base of right posterior neck. Non tender. No surrounding erythema.            Assessment & Plan:

## 2018-12-07 NOTE — Assessment & Plan Note (Signed)
Chronic. History of chronic BV that doesn't require ongoing treatment.   Exam today overall unremarkable. Wet pep and STD testing pending today.

## 2018-12-07 NOTE — Assessment & Plan Note (Signed)
Following with [facility] through [facility]. Recent pap smear results reviewed which appear unchanged.

## 2018-12-07 NOTE — Patient Instructions (Signed)
You will be contacted regarding your referral to dermatology.  Please let us know if you have not been contacted within two weeks.   We will be in touch with your lab results soon!  It was a pleasure to see you today!

## 2018-12-08 ENCOUNTER — Ambulatory Visit: Payer: BLUE CROSS/BLUE SHIELD | Admitting: Family Medicine

## 2018-12-08 LAB — C. TRACHOMATIS/N. GONORRHOEAE RNA
C. trachomatis RNA, TMA: NOT DETECTED
N. gonorrhoeae RNA, TMA: NOT DETECTED

## 2018-12-08 LAB — WET PREP BY MOLECULAR PROBE
Candida species: NOT DETECTED
Gardnerella vaginalis: NOT DETECTED
MICRO NUMBER:: 1155630
SPECIMEN QUALITY:: ADEQUATE
Trichomonas vaginosis: NOT DETECTED

## 2019-01-05 DIAGNOSIS — Z1231 Encounter for screening mammogram for malignant neoplasm of breast: Secondary | ICD-10-CM

## 2019-03-02 ENCOUNTER — Telehealth: Payer: Self-pay

## 2019-03-02 ENCOUNTER — Ambulatory Visit: Payer: BLUE CROSS/BLUE SHIELD | Admitting: Internal Medicine

## 2019-03-02 DIAGNOSIS — Z0289 Encounter for other administrative examinations: Secondary | ICD-10-CM

## 2019-03-02 NOTE — Progress Notes (Deleted)
Subjective:    Patient ID: Angel Reilly, female    DOB: Jun 05, 1975, 44 y.o.   MRN: [id number]  HPI  Pt presents to the clinic today with c/o right eye swelling.   Review of Systems  Past Medical History:  Diagnosis Date  . Abnormal Pap smear of cervix   . History of cervical dysplasia 01/15/2017  . Pap smear abnormality of cervix/human papillomavirus (HPV) positive   . Post partum depression     No current outpatient medications on file.   No current facility-administered medications for this visit.    No Known Allergies  Family History  Problem Relation Age of Onset  . Breast cancer Maternal Aunt        mid 77's  . Colon cancer Maternal Aunt   . Depression Mother   . Hypertension Mother   . Heart disease Father   . Heart attack Father   . Hypertension Father   . Depression Brother   . Heart disease Maternal Grandmother   . Hypertension Maternal Grandmother     Social History   Socioeconomic History  . Marital status: Single    Spouse name: Not on file  . Number of children: 1  . Years of education: Not on file  . Highest education level: Not on file  Occupational History  . Not on file  Tobacco Use  . Smoking status: Never Smoker  . Smokeless tobacco: Never Used  Substance and Sexual Activity  . Alcohol use: No  . Drug use: No  . Sexual activity: Yes    Birth control/protection: None  Other Topics Concern  . Not on file  Social History Narrative   Single.   1 child.   Works as an Administrator.    Scientist, physiological Strain:   . Difficulty of Paying Living Expenses: Not on file  Food Insecurity:   . Worried About Charity fundraiser in the Last Year: Not on file  . Ran Out of Food in the Last Year: Not on file  Transportation Needs:   . Lack of Transportation (Medical): Not on file  . Lack of Transportation (Non-Medical): Not on file  Physical Activity:   . Days of Exercise per Week: Not on file  . Minutes of  Exercise per Session: Not on file  Stress:   . Feeling of Stress : Not on file  Social Connections:   . Frequency of Communication with Friends and Family: Not on file  . Frequency of Social Gatherings with Friends and Family: Not on file  . Attends Religious Services: Not on file  . Active Member of Clubs or Organizations: Not on file  . Attends Archivist Meetings: Not on file  . Marital Status: Not on file  Intimate Partner Violence:   . Fear of Current or Ex-Partner: Not on file  . Emotionally Abused: Not on file  . Physically Abused: Not on file  . Sexually Abused: Not on file     Constitutional: Denies fever, malaise, fatigue, headache or abrupt weight changes.  HEENT: Pt reports right eye swelling. Denies eye pain, eye redness, ear pain, ringing in the ears, wax buildup, runny nose, nasal congestion, bloody nose, or sore throat. Respiratory: Denies difficulty breathing, shortness of breath, cough or sputum production.   Cardiovascular: Denies chest pain, chest tightness, palpitations or swelling in the hands or feet.  Gastrointestinal: Denies abdominal pain, bloating, constipation, diarrhea or blood in the stool.  GU: Denies  urgency, frequency, pain with urination, burning sensation, blood in urine, odor or discharge. Musculoskeletal: Denies decrease in range of motion, difficulty with gait, muscle pain or joint pain and swelling.  Skin: Denies redness, rashes, lesions or ulcercations.  Neurological: Denies dizziness, difficulty with memory, difficulty with speech or problems with balance and coordination.  Psych: Denies anxiety, depression, SI/HI.  No other specific complaints in a complete review of systems (except as listed in HPI above).     Objective:   Physical Exam   There were no vitals taken for this visit. Wt Readings from Last 3 Encounters:  12/07/18 226 lb 8 oz (102.7 kg)  06/14/18 226 lb (102.5 kg)  03/13/18 227 lb 15.3 oz (103.4 kg)     General: Appears their stated age, well developed, well nourished in NAD. Skin: Warm, dry and intact. No rashes, lesions or ulcerations noted. HEENT: Head: normal shape and size; Eyes: sclera white, no icterus, conjunctiva pink, PERRLA and EOMs intact; Ears: Tm's gray and intact, normal light reflex; Nose: mucosa pink and moist, septum midline; Throat/Mouth: Teeth present, mucosa pink and moist, no exudate, lesions or ulcerations noted.  Neck:  Neck supple, trachea midline. No masses, lumps or thyromegaly present.  Cardiovascular: Normal rate and rhythm. S1,S2 noted.  No murmur, rubs or gallops noted. No JVD or BLE edema. No carotid bruits noted. Pulmonary/Chest: Normal effort and positive vesicular breath sounds. No respiratory distress. No wheezes, rales or ronchi noted.  Abdomen: Soft and nontender. Normal bowel sounds. No distention or masses noted. Liver, spleen and kidneys non palpable. Musculoskeletal: Normal range of motion. No signs of joint swelling. No difficulty with gait.  Neurological: Alert and oriented. Cranial nerves II-XII grossly intact. Coordination normal.  Psychiatric: Mood and affect normal. Behavior is normal. Judgment and thought content normal.   EKG:  BMET    Component Value Date/Time   NA 139 10/20/2018 1541   K 3.8 10/20/2018 1541   CL 105 10/20/2018 1541   CO2 27 10/20/2018 1541   GLUCOSE 77 10/20/2018 1541   BUN 11 10/20/2018 1541   CREATININE 0.91 10/20/2018 1541   CALCIUM 9.4 10/20/2018 1541    Lipid Panel     Component Value Date/Time   CHOL 120 10/20/2018 1541   TRIG 37.0 10/20/2018 1541   HDL 52.30 10/20/2018 1541   CHOLHDL 2 10/20/2018 1541   VLDL 7.4 10/20/2018 1541   LDLCALC 60 10/20/2018 1541    CBC No results found for: WBC, RBC, HGB, HCT, PLT, MCV, MCH, MCHC, RDW, LYMPHSABS, MONOABS, EOSABS, BASOSABS  Hgb A1C Lab Results  Component Value Date   HGBA1C 5.1 11/24/2017           Assessment & Plan:    Webb Silversmith,  NP This visit occurred during the SARS-CoV-2 public health emergency.  Safety protocols were in place, including screening questions prior to the visit, additional usage of staff PPE, and extensive cleaning of exam room while observing appropriate contact time as indicated for disinfecting solutions.

## 2019-03-02 NOTE — Telephone Encounter (Addendum)
Melanie CMA sent team message that pt did not show up for her 10:30 Am appt. I was unable to reach pt by phone and I was going to leave v/m for pt to cb but v/m is full and could not leave message. DPR pt has says not to speak with no one. FYI to Avie Echevaria NP & [facility] CMA.

## 2019-03-02 NOTE — Telephone Encounter (Signed)
Will see at upcoming appt 

## 2019-03-02 NOTE — Telephone Encounter (Signed)
Tried to contact pt to see how she is doing since she did not show up for her apt today but no answer and no VM.

## 2019-03-02 NOTE — Telephone Encounter (Signed)
Robin at front desk said pt was on phone with allergic reaction and eyes were swollen shut; I spoke with pt and was told by pt that she had used a new face cream on 02/27/19 but did not put on eyes with no reaction and used face cream on face and eyes lids on 03/01/19 and around 2 AM eyes were itching and started to swell; pt took Benadryl at 2:30 AM without relief of swelling. Now pt does not have SOB or difficulty breathing, no CP, no swelling of mouth,lips,tongue or throat but throat does feel dry. Pt said no one was home with her except her children. I advised pt if Benadryl had not helped and eyes were swollen shut she would need to go to [facility] or ED. Pt said she was not going to go to ED for an allergic reaction and if I would not give her an appt she would not do anything.  I asked pt to hold on while I spoke with Gentry Fitz NP her PCP and pt said OK. When I came back to phone to let her know Kate's advice pt spoke for couple of minutes without me speaking of how she wanted to know why she was put on hold 5 times for an allergic reaction, why she was told to go to ED, why she could not get appt. When pt stopped talking I asked if I could tell her what Allie Bossier NP had said. Pt said yes, I advised that Gentry Fitz NP did not have available appt today but I could get her in to see another provider but first available was at 10:30 AM today with Avie Echevaria NP. Pt said that was OK. Pt has no covid symptoms, no travel and no known exposure to + covid. I advised pt per Anda Kraft that she could take another antihistamine if the last or only one she took was at 2:30 AM this morning and could apply ice or cold compress to eyes also.  I was concerned about pt driving if both her eyes were swollen shut; pt said she did not say that; both eyes are swollen but the rt eye is the worse and she can hold her eyelids up and see fine to drive. I apologized to pt if both myself and Shirlean Mylar had misunderstood what pts symptoms were. Pt scheduled to  see R Baity NP 03/02/19 at 10:30 and UC & ED precautions were given and pt voiced understanding. FYI to Avie Echevaria NP and Larene Beach RN.

## 2019-05-16 ENCOUNTER — Telehealth: Payer: BLUE CROSS/BLUE SHIELD | Admitting: Physician Assistant

## 2019-05-16 DIAGNOSIS — J309 Allergic rhinitis, unspecified: Secondary | ICD-10-CM

## 2019-05-16 MED ORDER — PREDNISONE 10 MG PO TABS: 40.0000 mg | ORAL_TABLET | Freq: Every day | ORAL | 0 refills | Status: DC

## 2019-05-16 NOTE — Progress Notes (Signed)
E visit for Allergic Rhinitis We are sorry that you are not feeling well.  Here is how we plan to help!  Based on what you have shared with me it looks like you have Allergic Rhinitis.  Rhinitis is when a reaction occurs that causes nasal congestion, runny nose, sneezing, and itching.  Most types of rhinitis are caused by an inflammation and are associated with symptoms in the eyes ears or throat. There are several types of rhinitis.  The most common are acute rhinitis, which is usually caused by a viral illness, allergic or seasonal rhinitis, and nonallergic or year-round rhinitis.  Nasal allergies occur certain times of the year.  Allergic rhinitis is caused when allergens in the air trigger the release of histamine in the body.  Histamine causes itching, swelling, and fluid to build up in the fragile linings of the nasal passages, sinuses and eyelids.  An itchy nose and clear discharge are common.  I recommend the following over the counter treatments: Allegra 60 mg twice daily  I also would recommend a nasal spray: Flonase 2 sprays into each nostril once daily  You may also benefit from eye drops such as: Visine 1-2 drops each eye twice daily as needed   Which are also over the counter  **We can try a prescription for prednisone to see if this additionally helps the inflammation  HOME CARE:   You can use an over-the-counter saline nasal spray as needed  Avoid areas where there is heavy dust, mites, or molds  Stay indoors on windy days during the pollen season  Keep windows closed in home, at least in bedroom; use air conditioner.  Use high-efficiency house air filter  Keep windows closed in car, turn AC on re-circulate  Avoid playing out with dog during pollen season  GET HELP RIGHT AWAY IF:   If your symptoms do not improve within 10 days  You become short of breath  You develop yellow or green discharge from your nose for over 3 days  You have coughing fits  MAKE  SURE YOU:   Understand these instructions  Will watch your condition  Will get help right away if you are not doing well or get worse  Thank you for choosing an e-visit. Your e-visit answers were reviewed by a board certified advanced clinical practitioner to complete your personal care plan. Depending upon the condition, your plan could have included both over the counter or prescription medications. Please review your pharmacy choice. Be sure that the pharmacy you have chosen is open so that you can pick up your prescription now.  If there is a problem you may message your provider in [place] to have the prescription routed to another pharmacy. Your safety is important to [place]. If you have drug allergies check your prescription carefully.  For the next 24 hours, you can use MyChart to ask questions about today's visit, request a non-urgent call back, or ask for a work or school excuse from your e-visit provider. You will get an email in the next two days asking about your experience. I hope that your e-visit has been valuable and will speed your recovery.      5  Minutes spent on chart

## 2019-05-17 ENCOUNTER — Encounter (HOSPITAL_BASED_OUTPATIENT_CLINIC_OR_DEPARTMENT_OTHER): Payer: Self-pay | Admitting: Emergency Medicine

## 2019-05-17 ENCOUNTER — Emergency Department (HOSPITAL_BASED_OUTPATIENT_CLINIC_OR_DEPARTMENT_OTHER)
Admission: EM | Admit: 2019-05-17 | Discharge: 2019-05-18 | Disposition: A | Discharge status: Home / Self Care | Payer: BLUE CROSS/BLUE SHIELD | Attending: Emergency Medicine | Admitting: Emergency Medicine

## 2019-05-17 ENCOUNTER — Other Ambulatory Visit: Payer: Self-pay

## 2019-05-17 DIAGNOSIS — R112 Nausea with vomiting, unspecified: Secondary | ICD-10-CM

## 2019-05-17 DIAGNOSIS — R42 Dizziness and giddiness: Secondary | ICD-10-CM | POA: Diagnosis present

## 2019-05-17 DIAGNOSIS — R200 Anesthesia of skin: Secondary | ICD-10-CM | POA: Diagnosis not present

## 2019-05-17 DIAGNOSIS — H532 Diplopia: Secondary | ICD-10-CM | POA: Diagnosis not present

## 2019-05-17 DIAGNOSIS — Z20822 Contact with and (suspected) exposure to covid-19: Secondary | ICD-10-CM | POA: Insufficient documentation

## 2019-05-17 LAB — URINALYSIS, MICROSCOPIC (REFLEX): WBC, UA: NONE SEEN WBC/hpf (ref 0–5)

## 2019-05-17 LAB — URINALYSIS, ROUTINE W REFLEX MICROSCOPIC
Bilirubin Urine: NEGATIVE
Glucose, UA: NEGATIVE mg/dL
Ketones, ur: NEGATIVE mg/dL
Leukocytes,Ua: NEGATIVE
Nitrite: NEGATIVE
Protein, ur: NEGATIVE mg/dL
Specific Gravity, Urine: >1.03 — ABNORMAL HIGH (ref 1.005–1.030)
pH: 5.5 (ref 5.0–8.0)

## 2019-05-17 LAB — CBC WITH DIFFERENTIAL/PLATELET
Abs Immature Granulocytes: 0.02 10*3/uL (ref 0.00–0.07)
Basophils Absolute: 0 10*3/uL (ref 0.0–0.1)
Basophils Relative: 0 %
Eosinophils Absolute: 0 10*3/uL (ref 0.0–0.5)
Eosinophils Relative: 0 %
HCT: 36.2 % (ref 36.0–46.0)
Hemoglobin: 12.4 g/dL (ref 12.0–15.0)
Immature Granulocytes: 0 %
Lymphocytes Relative: 10 %
Lymphs Abs: 0.9 10*3/uL (ref 0.7–4.0)
MCH: 31.6 pg (ref 26.0–34.0)
MCHC: 34.3 g/dL (ref 30.0–36.0)
MCV: 92.1 fL (ref 80.0–100.0)
Monocytes Absolute: 0.4 10*3/uL (ref 0.1–1.0)
Monocytes Relative: 5 %
Neutro Abs: 8 10*3/uL — ABNORMAL HIGH (ref 1.7–7.7)
Neutrophils Relative %: 85 %
Platelets: 236 10*3/uL (ref 150–400)
RBC: 3.93 MIL/uL (ref 3.87–5.11)
RDW: 12.4 % (ref 11.5–15.5)
WBC: 9.3 10*3/uL (ref 4.0–10.5)
nRBC: 0 % (ref 0.0–0.2)

## 2019-05-17 LAB — COMPREHENSIVE METABOLIC PANEL
ALT: 19 U/L (ref 0–44)
AST: 24 U/L (ref 15–41)
Albumin: 4.2 g/dL (ref 3.5–5.0)
Alkaline Phosphatase: 54 U/L (ref 38–126)
Anion gap: 10 (ref 5–15)
BUN: 13 mg/dL (ref 6–20)
CO2: 23 mmol/L (ref 22–32)
Calcium: 8.7 mg/dL — ABNORMAL LOW (ref 8.9–10.3)
Chloride: 105 mmol/L (ref 98–111)
Creatinine, Ser: 0.91 mg/dL (ref 0.44–1.00)
GFR calc Af Amer: >60 mL/min (ref >60)
GFR calc non Af Amer: >60 mL/min (ref >60)
Glucose, Bld: 169 mg/dL — ABNORMAL HIGH (ref 70–99)
Potassium: 3.3 mmol/L — ABNORMAL LOW (ref 3.5–5.1)
Sodium: 138 mmol/L (ref 135–145)
Total Bilirubin: 0.7 mg/dL (ref 0.3–1.2)
Total Protein: 7.8 g/dL (ref 6.5–8.1)

## 2019-05-17 LAB — PREGNANCY, URINE: Preg Test, Ur: NEGATIVE

## 2019-05-17 MED ORDER — SODIUM CHLORIDE 0.9 % IV BOLUS
500.0000 mL | Freq: Once | INTRAVENOUS | Status: AC
  Administered 2019-05-18: 500 mL via INTRAVENOUS

## 2019-05-17 MED ORDER — ONDANSETRON HCL 4 MG/2ML IJ SOLN
4.0000 mg | Freq: Once | INTRAMUSCULAR | Status: AC | PRN
  Administered 2019-05-17: 4 mg via INTRAVENOUS
  Filled 2019-05-17: qty 2

## 2019-05-17 NOTE — ED Notes (Signed)
ED Provider at bedside. 

## 2019-05-17 NOTE — ED Notes (Signed)
PT unable to give urine sample at this time.

## 2019-05-17 NOTE — ED Notes (Addendum)
Small emesis after pt walked to bathroom. IV Zofran given. Pt denies dizziness, states, feels tired and weak. PT bumped into wall in the room with RN standing by her, then turned and talked to family member in the room and asked them to get her phone. Pt ambulated well from Bathroom with RN stand by assist. Pt denied being dizzy they stated " I just felt off balance".

## 2019-05-17 NOTE — ED Triage Notes (Signed)
Patient presents with complaints of sudden onset dizziness, lips and tongue numbness and NV. States feels very fatigued.

## 2019-05-18 ENCOUNTER — Emergency Department (HOSPITAL_COMMUNITY): Payer: BLUE CROSS/BLUE SHIELD

## 2019-05-18 ENCOUNTER — Encounter (HOSPITAL_BASED_OUTPATIENT_CLINIC_OR_DEPARTMENT_OTHER): Payer: Self-pay | Admitting: Emergency Medicine

## 2019-05-18 ENCOUNTER — Emergency Department (HOSPITAL_BASED_OUTPATIENT_CLINIC_OR_DEPARTMENT_OTHER): Payer: BLUE CROSS/BLUE SHIELD

## 2019-05-18 DIAGNOSIS — H532 Diplopia: Secondary | ICD-10-CM

## 2019-05-18 DIAGNOSIS — R42 Dizziness and giddiness: Secondary | ICD-10-CM | POA: Diagnosis present

## 2019-05-18 DIAGNOSIS — R112 Nausea with vomiting, unspecified: Secondary | ICD-10-CM | POA: Diagnosis not present

## 2019-05-18 DIAGNOSIS — R200 Anesthesia of skin: Secondary | ICD-10-CM | POA: Diagnosis not present

## 2019-05-18 DIAGNOSIS — Z20822 Contact with and (suspected) exposure to covid-19: Secondary | ICD-10-CM | POA: Diagnosis not present

## 2019-05-18 LAB — RAPID URINE DRUG SCREEN, HOSP PERFORMED
Amphetamines: NOT DETECTED
Barbiturates: NOT DETECTED
Benzodiazepines: NOT DETECTED
Cocaine: NOT DETECTED
Opiates: NOT DETECTED
Tetrahydrocannabinol: NOT DETECTED

## 2019-05-18 LAB — SARS CORONAVIRUS 2 BY RT PCR (HOSPITAL ORDER, PERFORMED IN ~~LOC~~ HOSPITAL LAB): SARS Coronavirus 2: NEGATIVE

## 2019-05-18 MED ORDER — SODIUM CHLORIDE 0.9 % IV SOLN
1000.0000 mg | Freq: Once | INTRAVENOUS | Status: AC
  Administered 2019-05-18: 1000 mg via INTRAVENOUS
  Filled 2019-05-18: qty 8

## 2019-05-18 MED ORDER — GADOBUTROL 1 MMOL/ML IV SOLN
10.0000 mL | Freq: Once | INTRAVENOUS | Status: AC | PRN
  Administered 2019-05-18: 10 mL via INTRAVENOUS

## 2019-05-18 MED ORDER — IOHEXOL 350 MG/ML SOLN
80.0000 mL | Freq: Once | INTRAVENOUS | Status: AC | PRN
  Administered 2019-05-18: 80 mL via INTRAVENOUS

## 2019-05-18 MED ORDER — PREDNISONE 10 MG PO TABS
ORAL_TABLET | ORAL | 0 refills | Status: AC
Start: 2019-05-19 — End: 2019-05-20

## 2019-05-18 NOTE — Discharge Instructions (Addendum)
Please take prednisone as prescribed by Dr. Leonel Ramsay Please follow-up with neurology as referred. Please follow-up with physical therapy as referred

## 2019-05-18 NOTE — ED Notes (Signed)
Pt transported from [facility] for MRI workup. Pt denies any pain, but states she is still having some double vision and that started around 5pm yesterday.

## 2019-05-18 NOTE — ED Notes (Signed)
Pt transported to CT ?

## 2019-05-18 NOTE — ED Notes (Signed)
Pt to CT scan.

## 2019-05-18 NOTE — ED Provider Notes (Signed)
The patient arrived here in transfer from [place].  The patient had acute onset of fatigue and unsteadiness and vomiting.  Having diplopia.  Had a CT scan that was concerning for possible ventriculomegaly.  Was sent here for MRI of the brain.  MRI unfortunately shows that the patient has an acute stroke.  The patient has a child and has no one to care for them if she were to come into the hospital so she is reluctant to stay.  I discussed the case with Dr. Lorraine Lax, will come and discussed with the patient.  Recommended a CT angiogram of the head and neck.  Neurology independently reviewed the images and thinks this is much more likely to be MS.  They evaluated the patient at bedside and have ordered a gram of Solu-Medrol.  Patient currently is trying to figure out what she can do for childcare.  Is requesting to stay in the ED and have a few hours to try and see what she can get figured out.  If she is unable then the plan would be to try and schedule outpatient infusions of Solu-Medrol and outpatient neurology follow-up.  If she is able to come and then she would come in for 3 days of steroids.  Her MRI of the C-spine was ordered.  Signed out to Dr. Jeanell Sparrow please see her note for further details of care in the ED.   Deno Etienne, DO 05/18/19 [phone]

## 2019-05-18 NOTE — ED Notes (Signed)
Pt transported to MRI 

## 2019-05-18 NOTE — ED Provider Notes (Addendum)
[facility] EMERGENCY DEPARTMENT Provider Note   CSN: [id number] Arrival date & time: 05/17/19  2152     History Chief Complaint  Patient presents with  . Dizziness    Angel Reilly is a 44 y.o. female.  The history is provided by the patient.  Illness Location:  At home, seated at computer.  Had diplopia and numbness in the lips and anterior tongue Quality:  Also felt off balance  Severity:  Moderate Onset quality:  Sudden Duration:  4 hours Timing:  Constant Progression:  Unchanged Chronicity:  New Context:  Diagnosed with rhinitis but has not started medication Relieved by:  Nothing Worsened by:  Nothing Ineffective treatments:  None tried Associated symptoms: nausea and vomiting   Associated symptoms: no chest pain, no cough, no fever, no headaches, no myalgias, no rash and no shortness of breath   Risk factors:  None Patient was in her usual state of health until this evening.  She had sudden onset lip and anterior tongue numbness and double vision then developed nausea and vomiting.  She denies vertigo.  States she felt like she was going to pass out and was unable to get up from a seated position for 90 minutes.  No CP, no SOB.  No facial droop.  No extremity weakness.       Past Medical History:  Diagnosis Date  . Abnormal Pap smear of cervix   . History of cervical dysplasia 01/15/2017  . Pap smear abnormality of cervix/human papillomavirus (HPV) positive   . Post partum depression     Patient Active Problem List   Diagnosis Date Noted  . Vaginal discharge 12/07/2018  . Skin mass 12/07/2018  . Anxiety and depression 06/07/2017  . Obesity (BMI 35.0-39.9 without comorbidity) 06/07/2017  . Atypical squamous cells of undetermined significance (ASC-US) on cervical Pap smear 03/09/2014  . Carpal tunnel syndrome, unspecified upper limb 04/28/2010    Past Surgical History:  Procedure Laterality Date  . BREAST BIOPSY Left 2000   benign results  .  BREAST BIOPSY Left 2018   us/ bx/clip-neg fibradenoma  . DILATION AND CURETTAGE OF UTERUS    . WISDOM TOOTH EXTRACTION       OB History    Gravida  4   Para  1   Term  1   Preterm      AB  3   Living  1     SAB  2   TAB      Ectopic      Multiple      Live Births              Family History  Problem Relation Age of Onset  . Breast cancer Maternal Aunt        mid 8's  . Colon cancer Maternal Aunt   . Depression Mother   . Hypertension Mother   . Heart disease Father   . Heart attack Father   . Hypertension Father   . Depression Brother   . Heart disease Maternal Grandmother   . Hypertension Maternal Grandmother     Social History   Tobacco Use  . Smoking status: Never Smoker  . Smokeless tobacco: Never Used  Substance Use Topics  . Alcohol use: No  . Drug use: No    Home Medications Prior to Admission medications   Medication Sig Start Date End Date Taking? Authorizing Provider  predniSONE (DELTASONE) 10 MG tablet Take 4 tablets (40 mg total) by mouth daily  for 5 days. 05/16/19 05/21/19  Alveria Apley, PA-C    Allergies    Patient has no known allergies.  Review of Systems   Review of Systems  Constitutional: Negative for fever.  HENT: Negative for facial swelling.   Eyes: Positive for visual disturbance.  Respiratory: Negative for cough and shortness of breath.   Cardiovascular: Negative for chest pain.  Gastrointestinal: Positive for nausea and vomiting.  Genitourinary: Negative for dysuria.  Musculoskeletal: Negative for arthralgias and myalgias.  Skin: Negative for rash.  Neurological: Positive for numbness. Negative for dizziness, tremors, seizures, syncope, facial asymmetry and headaches.  Psychiatric/Behavioral: Negative for agitation and confusion.  All other systems reviewed and are negative.   Physical Exam Updated Vital Signs BP 113/76   Pulse 67   Temp 98.8 F (37.1 C) (Oral)   Resp 13   Ht 5\' 7"  (1.702 m)   Wt  101.2 kg   LMP 05/17/2019   SpO2 100%   BMI 34.93 kg/m   Physical Exam Vitals and nursing note reviewed.  Constitutional:      General: She is not in acute distress.    Appearance: Normal appearance.  HENT:     Head: Normocephalic and atraumatic.     Nose: Nose normal.     Mouth/Throat:     Mouth: Mucous membranes are moist.     Pharynx: Oropharynx is clear.  Eyes:     Extraocular Movements: Extraocular movements intact.     Conjunctiva/sclera: Conjunctivae normal.     Pupils: Pupils are equal, round, and reactive to light.  Cardiovascular:     Rate and Rhythm: Normal rate and regular rhythm.     Pulses: Normal pulses.     Heart sounds: Normal heart sounds.  Pulmonary:     Effort: Pulmonary effort is normal.     Breath sounds: Normal breath sounds.  Abdominal:     General: Abdomen is flat. Bowel sounds are normal.     Tenderness: There is no abdominal tenderness. There is no guarding.  Musculoskeletal:        General: Normal range of motion.     Cervical back: Normal range of motion and neck supple.  Skin:    General: Skin is warm and dry.     Capillary Refill: Capillary refill takes less than 2 seconds.  Neurological:     General: No focal deficit present.     Mental Status: She is alert and oriented to person, place, and time.     Cranial Nerves: No cranial nerve deficit.     Deep Tendon Reflexes: Reflexes normal.  Psychiatric:        Mood and Affect: Mood normal.        Behavior: Behavior normal.     ED Results / Procedures / Treatments   Labs (all labs ordered are listed, but only abnormal results are displayed) Results for orders placed or performed during the hospital encounter of 05/17/19  CBC with Differential  Result Value Ref Range   WBC 9.3 4.0 - 10.5 K/uL   RBC 3.93 3.87 - 5.11 MIL/uL   Hemoglobin 12.4 12.0 - 15.0 g/dL   HCT 36.2 36.0 - 46.0 %   MCV 92.1 80.0 - 100.0 fL   MCH 31.6 26.0 - 34.0 pg   MCHC 34.3 30.0 - 36.0 g/dL   RDW 12.4 11.5 -  15.5 %   Platelets 236 150 - 400 K/uL   nRBC 0.0 0.0 - 0.2 %   Neutrophils Relative % 85 %  Neutro Abs 8.0 (H) 1.7 - 7.7 K/uL   Lymphocytes Relative 10 %   Lymphs Abs 0.9 0.7 - 4.0 K/uL   Monocytes Relative 5 %   Monocytes Absolute 0.4 0.1 - 1.0 K/uL   Eosinophils Relative 0 %   Eosinophils Absolute 0.0 0.0 - 0.5 K/uL   Basophils Relative 0 %   Basophils Absolute 0.0 0.0 - 0.1 K/uL   Immature Granulocytes 0 %   Abs Immature Granulocytes 0.02 0.00 - 0.07 K/uL  Comprehensive metabolic panel  Result Value Ref Range   Sodium 138 135 - 145 mmol/L   Potassium 3.3 (L) 3.5 - 5.1 mmol/L   Chloride 105 98 - 111 mmol/L   CO2 23 22 - 32 mmol/L   Glucose, Bld 169 (H) 70 - 99 mg/dL   BUN 13 6 - 20 mg/dL   Creatinine, Ser 0.91 0.44 - 1.00 mg/dL   Calcium 8.7 (L) 8.9 - 10.3 mg/dL   Total Protein 7.8 6.5 - 8.1 g/dL   Albumin 4.2 3.5 - 5.0 g/dL   AST 24 15 - 41 U/L   ALT 19 0 - 44 U/L   Alkaline Phosphatase 54 38 - 126 U/L   Total Bilirubin 0.7 0.3 - 1.2 mg/dL   GFR calc non Af Amer >60 >60 mL/min   GFR calc Af Amer >60 >60 mL/min   Anion gap 10 5 - 15  Urinalysis, Routine w reflex microscopic  Result Value Ref Range   Color, Urine YELLOW YELLOW   APPearance CLOUDY (A) CLEAR   Specific Gravity, Urine >1.030 (H) 1.005 - 1.030   pH 5.5 5.0 - 8.0   Glucose, UA NEGATIVE NEGATIVE mg/dL   Hgb urine dipstick MODERATE (A) NEGATIVE   Bilirubin Urine NEGATIVE NEGATIVE   Ketones, ur NEGATIVE NEGATIVE mg/dL   Protein, ur NEGATIVE NEGATIVE mg/dL   Nitrite NEGATIVE NEGATIVE   Leukocytes,Ua NEGATIVE NEGATIVE  Pregnancy, urine  Result Value Ref Range   Preg Test, Ur NEGATIVE NEGATIVE  Urinalysis, Microscopic (reflex)  Result Value Ref Range   RBC / HPF 6-10 0 - 5 RBC/hpf   WBC, UA NONE SEEN 0 - 5 WBC/hpf   Bacteria, UA RARE (A) NONE SEEN   Squamous Epithelial / LPF 6-10 0 - 5   Mucus PRESENT    CT Head Wo Contrast  Result Date: 05/18/2019 CLINICAL DATA:  Sudden onset dizziness EXAM: CT  HEAD WITHOUT CONTRAST TECHNIQUE: Contiguous axial images were obtained from the base of the skull through the vertex without intravenous contrast. COMPARISON:  None. FINDINGS: Brain: No evidence of acute territorial infarction, hemorrhage, hydrocephalus,extra-axial collection or mass lesion/mass effect. Normal gray-white differentiation. The ventricles appear to be mildly dilated out of proportion to the sulci. Vascular: No hyperdense vessel or unexpected calcification. Skull: The skull is intact. No fracture or focal lesion identified. Sinuses/Orbits: The visualized paranasal sinuses and mastoid air cells are clear. The orbits and globes intact. Other: None IMPRESSION: No acute intracranial abnormality. Mild ventricular dilation somewhat out of proportion to the sulci which may be due to hydrocephalus. If further evaluation is required would recommend non emergent MRI Electronically Signed   By: Prudencio Pair M.D.   On: 05/18/2019 00:39    EKG EKG Interpretation  Date/Time:  Wednesday May 17 2019 22:19:35 EDT Ventricular Rate:  67 PR Interval:    QRS Duration: 108 QT Interval:  436 QTC Calculation: 461 R Axis:   67 Text Interpretation: Sinus rhythm Confirmed by Randal Buba, Latorie Montesano ([id number]) on 05/17/2019 11:04:51 PM  Radiology CT Head Wo Contrast  Result Date: 05/18/2019 CLINICAL DATA:  Sudden onset dizziness EXAM: CT HEAD WITHOUT CONTRAST TECHNIQUE: Contiguous axial images were obtained from the base of the skull through the vertex without intravenous contrast. COMPARISON:  None. FINDINGS: Brain: No evidence of acute territorial infarction, hemorrhage, hydrocephalus,extra-axial collection or mass lesion/mass effect. Normal gray-white differentiation. The ventricles appear to be mildly dilated out of proportion to the sulci. Vascular: No hyperdense vessel or unexpected calcification. Skull: The skull is intact. No fracture or focal lesion identified. Sinuses/Orbits: The visualized paranasal sinuses and  mastoid air cells are clear. The orbits and globes intact. Other: None IMPRESSION: No acute intracranial abnormality. Mild ventricular dilation somewhat out of proportion to the sulci which may be due to hydrocephalus. If further evaluation is required would recommend non emergent MRI Electronically Signed   By: Prudencio Pair M.D.   On: 05/18/2019 00:39    Procedures Procedures (including critical care time)  Medications Ordered in ED Medications  ondansetron (ZOFRAN) injection 4 mg (4 mg Intravenous Given 05/17/19 2321)  sodium chloride 0.9 % bolus 500 mL (500 mLs Intravenous New Bag/Given 05/18/19 0036)     Visual Acuity  Right Eye Distance: 20/20 Left Eye Distance: 20/20 Bilateral Distance: 20/20  Right Eye Near: R Near: 20/20 Left Eye Near:  L Near: 20/20 Bilateral Near:  20/20(Wearing Glasses.)  Orthostatic VS for the past 24 hrs:  BP- Lying Pulse- Lying BP- Sitting Pulse- Sitting BP- Standing at 0 minutes Pulse- Standing at 0 minutes  05/18/19 0015 108/59 68 113/76 75 126/59 78    ED Course  I have reviewed the triage vital signs and the nursing notes.  Pertinent labs & imaging results that were available during my care of the patient were reviewed by me and considered in my medical decision making (see chart for details).   Differential includes 1. CVA 2. MS 3. Space occupying lesion      1257 am case d/w Dr. Lorraine Lax.  Please transfer for MRI of the brain w/w/o.  Please add on UDS.    Case d/w Dr. Tyrone Nine who accepts patient in transfer.     Explained to patient the finding of questioned hydrocephalus and need for transfer to [facility] for MRI of the brain.  Patient is in agreement with this plan.   Final Clinical Impression(s) / ED Diagnoses Final diagnoses:  Diplopia    Transfer to [facility] via [facility] for MRI and further evaluation        Carless Slatten, MD 05/18/19 0127    Paulene Tayag, MD 05/18/19 [id number]

## 2019-05-18 NOTE — Discharge Planning (Signed)
RNCM met with pt at bedside regarding outpatient treatment options for new diagnosis of MS.  RNCM explained home IV infusion of steroids vs oral.  Pt states there is a possibility of her going home and returning on Saturday after her mom comes to town.  Dr Leonel Ramsay entered room to also discuss options.  RNCM will follow for disposition treatment decision.

## 2019-05-18 NOTE — Consult Note (Addendum)
Requesting Physician: Dr. Tyrone Nine    Chief Complaint: double vision, gait instability   History obtained from: Patient and Chart     HPI:                                                                                                                                       Angel Reilly is a 44 y.o. female with past medical history significant for obesity presents to the emergency department at [facility] after she developed sudden onset dizziness, nausea vomiting followed by double vision that started around 5 PM yesterday.  Patient states that couple days ago she had odd sensation of her face feeling cold.  A few weeks ago she also had sudden loss of taste, was tested for Covid 19 but negative.  She was feeling her normal self today and finished work when she suddenly had the above symptoms.  She initially thought she may have had food poisoning and called her friend who took her to [place]. She also states in the past she has had unexplained episodes of her arm or leg cramping up, unexplained paresthesias.  Work-up in med [place] included CT head which was unremarkable except for evidence of ventriculomegaly.  Dr. Nicholes Stairs called neurology at [facility] and patient was transferred to get MRI brain to rule out acute stroke, mass occupying lesion or MS. urine drug screen negative.  MRI brain was performed at [facility] read as left pontine infarct.  Neurology was consulted for further recommendations.  Family history significant for mother was rheumatoid arthritis.  Patient is a non-smoker.  Date last known well: 05/18/2019 Time last known well: 5 PM tPA Given: No outside window NIHSS: 0 Baseline MRS 0   Past Medical History:  Diagnosis Date  . Abnormal Pap smear of cervix   . History of cervical dysplasia 01/15/2017  . Pap smear abnormality of cervix/human papillomavirus (HPV) positive   . Post partum depression     Past Surgical History:   Procedure Laterality Date  . BREAST BIOPSY Left 2000   benign results  . BREAST BIOPSY Left 2018   us/ bx/clip-neg fibradenoma  . DILATION AND CURETTAGE OF UTERUS    . WISDOM TOOTH EXTRACTION      Family History  Problem Relation Age of Onset  . Breast cancer Maternal Aunt        mid 46's  . Colon cancer Maternal Aunt   . Depression Mother   . Hypertension Mother   . Heart disease Father   . Heart attack Father   . Hypertension Father   . Depression Brother   . Heart disease Maternal Grandmother   . Hypertension Maternal Grandmother    Social History:  reports that she has never smoked. She has never used smokeless tobacco. She reports that she does not drink alcohol or use drugs.  Allergies: No Known Allergies  Medications:                                                                                                                        I reviewed home medications   ROS:                                                                                                                                     14 systems reviewed and negative except above   Examination:                                                                                                      General: Appears well-developed . Psych: Affect appropriate to situation Eyes: No scleral injection HENT: No OP obstrucion Head: Normocephalic.  Cardiovascular: Normal rate and regular rhythm.  Respiratory: Effort normal and breath sounds normal to anterior ascultation GI: Soft.  No distension. There is no tenderness.  Skin: WDI    Neurological Examination Mental Status: Alert, oriented, thought content appropriate.  Speech fluent without evidence of aphasia. Able to follow 3 step commands without difficulty. Cranial Nerves: II: Visual fields grossly normal,  III,IV, VI: ptosis not present, extra-ocular motions intact bilaterally, pupils equal, round, reactive to light and accommodation V,VII: smile  symmetric, facial light touch sensation normal bilaterally VIII: hearing normal bilaterally IX,X: uvula rises symmetrically XI: bilateral shoulder shrug XII: midline tongue extension Motor: Right : Upper extremity   5/5    Left:     Upper extremity   5/5  Lower extremity   5/5     Lower extremity   5/5 Tone and bulk:normal tone throughout; no atrophy noted Sensory: Pinprick and light touch intact throughout, bilaterally Deep Tendon Reflexes: 3+ reflexes in the right patella with crossed adductor's, 2+ over the left Plantars: Right: downgoing   Left: downgoing Cerebellar: normal finger-to-nose, normal rapid alternating movements and normal heel-to-shin test      Lab Results: Basic Metabolic Panel: Recent Labs  Lab 05/17/19 2215  NA 138  K 3.3*  CL 105  CO2 23  GLUCOSE 169*  BUN 13  CREATININE 0.91  CALCIUM 8.7*    CBC: Recent Labs  Lab 05/17/19 2215  WBC 9.3  NEUTROABS 8.0*  HGB 12.4  HCT 36.2  MCV 92.1  PLT 236    Coagulation Studies: No results for input(s): LABPROT, INR in the last 72 hours.  Imaging: CT Angio Head W or Wo Contrast  Result Date: 05/18/2019 CLINICAL DATA:  Stroke EXAM: CT ANGIOGRAPHY HEAD AND NECK TECHNIQUE: Multidetector CT imaging of the head and neck was performed using the standard protocol during bolus administration of intravenous contrast. Multiplanar CT image reconstructions and MIPs were obtained to evaluate the vascular anatomy. Carotid stenosis measurements (when applicable) are obtained utilizing NASCET criteria, using the distal internal carotid diameter as the denominator. CONTRAST:  71mL OMNIPAQUE IOHEXOL 350 MG/ML SOLN COMPARISON:  Brain MR earlier today FINDINGS: CTA NECK FINDINGS Aortic arch: 3 vessel arch which is negative. Right carotid system: Vessels are smooth and widely patent. No atheromatous changes Left carotid system: Vessels are smooth and widely patent. No atheromatous changes Vertebral arteries: No proximal  subclavian abnormality. Mild left vertebral artery dominance. 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 No branch occlusion, beading, or aneurysm. No atheromatous changes. Posterior circulation: Small basilar artery in the setting of persistent trigeminal artery on the right. No branch occlusion, beading, or aneurysm Venous sinuses: Negative Anatomic variants: As above Review of the MIP images confirms the above findings IMPRESSION: 1. No emergent finding or atherosclerosis. In this patient with no reported small-vessel risk factors and unusual brainstem location, a demyelinating/inflammatory cause of the MR abnormality is increasingly likely. 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 Electronically Signed   By: Monte Fantasia M.D.   On: 05/18/2019 04:58   CT Head Wo Contrast  Result Date: 05/18/2019 CLINICAL DATA:  Sudden onset dizziness EXAM: CT HEAD WITHOUT CONTRAST TECHNIQUE: Contiguous axial images were obtained from the base of the skull through the vertex without intravenous contrast. COMPARISON:  None. FINDINGS: Brain: No evidence of acute territorial infarction, hemorrhage, hydrocephalus,extra-axial collection or mass lesion/mass effect. Normal gray-white differentiation. The ventricles appear to be mildly dilated out of proportion to the sulci. Vascular: No hyperdense vessel or unexpected calcification. Skull: The skull is intact. No fracture or focal lesion identified. Sinuses/Orbits: The visualized paranasal sinuses and mastoid air cells are clear. The orbits and globes intact. Other: None IMPRESSION: No acute intracranial abnormality. Mild ventricular dilation somewhat out of proportion to the sulci which may be due to hydrocephalus. If further evaluation is required would recommend non emergent MRI Electronically Signed   By: Prudencio Pair  M.D.   On: 05/18/2019 00:39   CT Angio Neck W and/or Wo Contrast  Result Date: 05/18/2019 CLINICAL DATA:  Stroke EXAM: CT ANGIOGRAPHY HEAD AND NECK TECHNIQUE: Multidetector CT imaging of the head and neck was performed using the standard protocol during bolus administration of intravenous contrast. Multiplanar CT image reconstructions and MIPs were obtained to evaluate the vascular anatomy. Carotid stenosis measurements (when applicable) are obtained utilizing NASCET criteria, using the distal internal carotid diameter as the denominator. CONTRAST:  52mL OMNIPAQUE IOHEXOL 350 MG/ML SOLN COMPARISON:  Brain MR earlier today FINDINGS: CTA NECK FINDINGS Aortic arch: 3 vessel arch which is negative. Right carotid system: Vessels are smooth and widely patent. No atheromatous changes Left  carotid system: Vessels are smooth and widely patent. No atheromatous changes Vertebral arteries: No proximal subclavian abnormality. Mild left vertebral artery dominance. 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 No branch occlusion, beading, or aneurysm. No atheromatous changes. Posterior circulation: Small basilar artery in the setting of persistent trigeminal artery on the right. No branch occlusion, beading, or aneurysm Venous sinuses: Negative Anatomic variants: As above Review of the MIP images confirms the above findings IMPRESSION: 1. No emergent finding or atherosclerosis. In this patient with no reported small-vessel risk factors and unusual brainstem location, a demyelinating/inflammatory cause of the MR abnormality is increasingly likely. 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 Electronically Signed   By: Monte Fantasia M.D.   On: 05/18/2019 04:58   MR Brain W and Wo Contrast  Result Date: 05/18/2019 CLINICAL DATA:  Dizziness with numbness of the lips and  tongue EXAM: MRI HEAD WITHOUT AND WITH CONTRAST TECHNIQUE: Multiplanar, multiecho pulse sequences of the brain and surrounding structures were obtained without and with intravenous contrast. CONTRAST:  78mL GADAVIST GADOBUTROL 1 MMOL/ML IV SOLN COMPARISON:  None. FINDINGS: Brain: There is a small focus of abnormal diffusion restriction within the left dorsal pons, series 5 images 64-67. No other diffusion abnormality. Normal white matter signal. There is marked ventriculomegaly with volume loss greater than expected for age no chronic microhemorrhage. Normal midline structures. Vascular: Normal flow voids. Skull and upper cervical spine: Normal marrow signal. Sinuses/Orbits: Negative. Other: None. IMPRESSION: 1. Small focus of abnormal diffusion restriction within the left dorsal pons, consistent with small acute infarct. No associated hemorrhage or mass effect. 2. Ventriculomegaly with volume loss greater than expected for age. Electronically Signed   By: Ulyses Jarred M.D.   On: 05/18/2019 03:48     ASSESSMENT AND PLAN   44 y.o. female with past medical history significant for obesity presents to the emergency department at [facility] after she developed sudden onset dizziness, nausea vomiting followed by double vision that started around 5 PM yesterday.  However reviewing the MRI Brain, lesion in the left pontocerebellar junction appears more consistent with inflammatory lesion with some degree of restriction diffusion and contrast pickup  rather than an acute stroke.  In addition patient has a few periventricular T2 white matter lesions.  This plus additional history of paresthesias, loss of taste, cramps would favor multiple clinical events-thus favoring diagnosis of multiple sclerosis. Also CTA does not show any evidence of stenosis.     Impression Likely multiple sclerosis, less likely to be acute ischemic stroke   Recommendations 1 g Solu-Medrol IV x 3 days MRI C-spine with and  without contrast  Ms. Luckey is making arrangements to have her 70-year-old daughter taking care of and until then requests not to be admitted.  If she is unable to make arrangements, consider high-dose oral steroids versus IV steroids at outpatient infusion center.   Trig Mcbryar [facility] Pager Number [phone]

## 2019-05-18 NOTE — ED Notes (Signed)
Dr. Aroor at bedside 

## 2019-05-18 NOTE — ED Provider Notes (Signed)
44 yo female with dizziness and diploplia here with question of MS on MRI. Due to child care issues, it is unclear if patient will stay in hospital.  Patient received first dose of solumedrol.  Will either admit or attempt to have patient treated op depending on patient's social situation.  Discussed with transitions of care and with Dr. Leonel Ramsay.  Patient has received her first dose of Solu-Medrol in the department.  Dr. Leonel Ramsay feels the patient can have prednisone 1250 mg for the next 2 days.  He is also placing a referral to Dr. Felecia Shelling for outpatient.  We will also like physical therapy outpatient.   Pattricia Boss, MD 05/18/19 1013

## 2019-05-19 ENCOUNTER — Telehealth: Payer: Self-pay

## 2019-05-19 ENCOUNTER — Telehealth: Payer: Self-pay | Admitting: *Deleted

## 2019-05-19 MED FILL — Ondansetron HCl Inj 4 MG/2ML (2 MG/ML): INTRAMUSCULAR | Qty: 2 | Status: AC

## 2019-05-19 NOTE — Telephone Encounter (Signed)
Please notify patient that I can gladly place a home health referral, but I do not have any experience in ordering IV prednisone.  When is her appointment with her new neurologist?

## 2019-05-19 NOTE — Telephone Encounter (Signed)
Patient has been dx with multiple sclerosis. She states they have put her on a high dose prednisone - where she will have to take 13 pills at one time. Patient states she is hesitant to do this because it makes her feel extremely nauseated and just "feel terrible."  Patient states she was told to collaborate with Anda Kraft on getting a referral for home health, and getting prednisone prescribed where she can do this at home through an IV. Her neurologist is Dr. Leonel Ramsay. Patient states she wants a call ASAP.

## 2019-05-19 NOTE — Telephone Encounter (Signed)
Noted, this is reasonable.

## 2019-05-19 NOTE — Telephone Encounter (Signed)
Pt called for direction in taking prescribed Rx.  EDRN confirmed with Neurologist to take 13 tablets at once.

## 2019-05-19 NOTE — Telephone Encounter (Signed)
Spoken and notified patient of Angel Reilly comments. Patient stated that she will go ahead and take the prednisone and will update Angel Reilly after the appointment on 05/25/2019

## 2019-05-24 ENCOUNTER — Other Ambulatory Visit: Payer: Self-pay

## 2019-05-24 ENCOUNTER — Ambulatory Visit: Payer: BLUE CROSS/BLUE SHIELD | Admitting: Neurology

## 2019-05-24 ENCOUNTER — Encounter: Payer: Self-pay | Admitting: Neurology

## 2019-05-24 ENCOUNTER — Telehealth: Payer: Self-pay | Admitting: *Deleted

## 2019-05-24 VITALS — BP 122/74 | HR 85 | Ht 67.0 in | Wt 234.6 lb

## 2019-05-24 DIAGNOSIS — H532 Diplopia: Secondary | ICD-10-CM | POA: Diagnosis not present

## 2019-05-24 DIAGNOSIS — R9089 Other abnormal findings on diagnostic imaging of central nervous system: Secondary | ICD-10-CM

## 2019-05-24 DIAGNOSIS — R7989 Other specified abnormal findings of blood chemistry: Secondary | ICD-10-CM | POA: Insufficient documentation

## 2019-05-24 DIAGNOSIS — R2689 Other abnormalities of gait and mobility: Secondary | ICD-10-CM

## 2019-05-24 DIAGNOSIS — G4710 Hypersomnia, unspecified: Secondary | ICD-10-CM

## 2019-05-24 HISTORY — DX: Other abnormal findings on diagnostic imaging of central nervous system: R90.89

## 2019-05-24 MED ORDER — MODAFINIL 200 MG PO TABS: 200.0000 mg | ORAL_TABLET | Freq: Every day | ORAL | 3 refills | Status: DC

## 2019-05-24 NOTE — Telephone Encounter (Addendum)
Submitted PA modafinil on CMM. [id number]. Waiting on determination from [facility].

## 2019-05-24 NOTE — Progress Notes (Addendum)
[facility]  PATIENT: Angel Reilly DOB: 1975-02-21  REFERRING DOCTOR OR PCP: Alma Friendly, NP SOURCE: Patient, notes from primary care, notes from [facility] emergency room, imaging and lab reports, MRI images personally reviewed.  _________________________________   HISTORICAL  CHIEF COMPLAINT:  Chief Complaint  Patient presents with  . New Patient (Initial Visit)    RM 13, alone. Referral for MS. Seen at [facility] 05/17/19-05/18/19 for diplopia/dizziness. Received IV steroids and referred for outpt PT. Had MRI's while at [facility]. Needs form filled out wihle here today.     HISTORY OF PRESENT ILLNESS:  I had the pleasure of seeing your patient, Angel Reilly, at the multiple sclerosis Center at [facility] for neurologic consultation regarding her recent  She is a 44 year old woman who had the onset of vomiting and poor balance and diplopia 05/17/19.   A CT scan was performed that was read as showing no acute findings.    Due to the diplopia, an MRI of the brain was also performed.   The MRI of the brain showed a dorsal pontine focus on the left that had some DWI restriction.  Initially, this was felt to represent a stroke and CT angiogram was also performed.  The CT angiogram was normal..      She also notes that she has had numbness in her lips and reduced taste for the past month or so.   She also noted mild right hand numbness for the past week.    She has had mild left ptosis x years.   Sometimes hr toes and right hand lock up.    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    She reports fatigue and hypersomnia since the symptoms started.    Due to the atrophy, ventriculomegaly, I also asked questions about development,.   She reports reaching all developmental milestones at the appropriate age.    Data reviewed: Personally reviewed the CT scan and MRI of the head and cervical spine from 05/18/2019.  The CT scan showed ventriculomegaly and  slight hypodensity in the posterior left pons.  This is confirmed as a T2/flair hyperintense focus on brain MRI.  It did not enhance.  However, it was mildly hyperintense on diffusion-weighted imaging.  Of note, it is not hypointense on the ADC maps, more consistent with inflammation than with ischemia.  Additionally, there are a few punctate T2/FLAIR hyperintense foci in the white matter of the hemispheres that appear nonspecific.  The ventriculomegaly is more pronounced in the left lateral ventricle.  The temporal horns are not enlarged.  There is mild volume loss in the brain as well.  With contrast, there is 1 tiny spot of enhancement noted only on the sagittal images but not on coronal or axial views there are no older brain images for comparison.    CT angiogram was also reviewed.  There was no significant stenosis noted.  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  CBC and CMP showed mild hypokalemia and was otherwise unremarkable.  Urine tox screen was negative.  SARS-CoV-2 test was negative.  REVIEW OF SYSTEMS: Constitutional: No fevers, chills, sweats, or change in appetite Eyes: No visual changes, double vision, eye pain Ear, nose and throat: No hearing loss, ear pain, nasal congestion, sore throat Cardiovascular: No chest pain, palpitations Respiratory: No shortness of breath at rest or with exertion.   No wheezes GastrointestinaI: No nausea, vomiting, diarrhea, abdominal pain, fecal incontinence Genitourinary:  No dysuria, urinary retention or frequency.  No nocturia. Musculoskeletal: No neck pain, back pain Integumentary: No rash, pruritus, skin lesions Neurological: as above Psychiatric: No depression at this time.  No anxiety Endocrine: No palpitations, diaphoresis, change in appetite, change in weigh or increased thirst Hematologic/Lymphatic: No anemia, purpura, petechiae. Allergic/Immunologic: No itchy/runny eyes, nasal congestion, recent  allergic reactions, rashes  ALLERGIES: No Known Allergies  HOME MEDICATIONS:  Current Outpatient Medications:  .  modafinil (PROVIGIL) 200 MG tablet, Take 1 tablet (200 mg total) by mouth daily., Disp: 30 tablet, Rfl: 3  PAST MEDICAL HISTORY: Past Medical History:  Diagnosis Date  . Abnormal Pap smear of cervix   . History of cervical dysplasia 01/15/2017  . Pap smear abnormality of cervix/human papillomavirus (HPV) positive   . Post partum depression     PAST SURGICAL HISTORY: Past Surgical History:  Procedure Laterality Date  . BREAST BIOPSY Left 2000   benign results  . BREAST BIOPSY Left 2018   us/ bx/clip-neg fibradenoma  . DILATION AND CURETTAGE OF UTERUS    . WISDOM TOOTH EXTRACTION      FAMILY HISTORY: Family History  Problem Relation Age of Onset  . Breast cancer Maternal Aunt        mid 56's  . Colon cancer Maternal Aunt   . Depression Mother   . Hypertension Mother   . Heart disease Father   . Heart attack Father   . Hypertension Father   . Depression Brother   . Heart disease Maternal Grandmother   . Hypertension Maternal Grandmother     SOCIAL HISTORY:  Social History   Socioeconomic History  . Marital status: Single    Spouse name: Not on file  . Number of children: 1  . Years of education: Not on file  . Highest education level: Not on file  Occupational History  . Not on file  Tobacco Use  . Smoking status: Never Smoker  . Smokeless tobacco: Never Used  Substance and Sexual Activity  . Alcohol use: Yes    Comment: wine 1-2 per month  . Drug use: No  . Sexual activity: Yes    Birth control/protection: None  Other Topics Concern  . Not on file  Social History Narrative   Caffeine use: Soda daily   Single.   1 child.   Works as an Administrator.    Scientist, physiological Strain:   . Difficulty of Paying Living Expenses:   Food Insecurity:   . Worried About Charity fundraiser in the Last Year:   . Arts development officer in the Last Year:   Transportation Needs:   . Film/video editor (Medical):   Marland Kitchen Lack of Transportation (Non-Medical):   Physical Activity:   . Days of Exercise per Week:   . Minutes of Exercise per Session:   Stress:   . Feeling of Stress :   Social Connections:   . Frequency of Communication with Friends and Family:   . Frequency of Social Gatherings with Friends and Family:   . Attends Religious Services:   . Active Member of Clubs or Organizations:   . Attends Archivist Meetings:   Marland Kitchen Marital Status:   Intimate Partner Violence:   . Fear of Current or Ex-Partner:   . Emotionally Abused:   Marland Kitchen Physically Abused:   . Sexually Abused:      PHYSICAL EXAM  Vitals:   05/24/19 1343  BP: 122/74  Pulse:  85  Weight: 234 lb 9.6 oz (106.4 kg)  Height: 5\' 7"  (1.702 m)    Body mass index is 36.74 kg/m.   General: The patient is well-developed and well-nourished and in no acute distress  HEENT:  Head is [place]/AT.  Sclera are anicteric.  Funduscopic exam shows normal optic discs and retinal vessels.  Neck: No carotid bruits are noted.  The neck is nontender.  Cardiovascular: The heart has a regular rate and rhythm with a normal S1 and S2. There were no murmurs, gallops or rubs.    Skin: Extremities are without rash or  edema.  Musculoskeletal:  Back is nontender  Neurologic Exam  Mental status: The patient is alert and oriented x 3 at the time of the examination. The patient has apparent normal recent and remote memory, with an apparently normal attention span and concentration ability.   Speech is normal.  Cranial nerves: Extraocular movements are full. Pupils are equal, round, and reactive to light and accomodation.  Visual fields are full.  Facial symmetry is present. There is good facial sensation to soft touch bilaterally.Facial strength is normal.  Trapezius and sternocleidomastoid strength is normal. No dysarthria is noted.  No obvious hearing  deficits are noted.  Motor:  Muscle bulk is normal.   Tone is normal. Strength is  5 / 5 in all 4 extremities.   Sensory: Sensory testing is intact to pinprick, soft touch and vibration sensation in all 4 extremities.  Coordination: Cerebellar testing reveals good finger-nose-finger and heel-to-shin bilaterally.  Gait and station: Station is normal.   Her gait is mildly wide.  Her turn is unstable.  The tandem gait is wide.  Romberg is negative.   Reflexes: Deep tendon reflexes are symmetric and normal bilaterally.   Plantar responses are flexor.    DIAGNOSTIC DATA (LABS, IMAGING, TESTING) - I reviewed patient records, labs, notes, testing and imaging myself where available.  Lab Results  Component Value Date   WBC 9.3 05/17/2019   HGB 12.4 05/17/2019   HCT 36.2 05/17/2019   MCV 92.1 05/17/2019   PLT 236 05/17/2019      Component Value Date/Time   NA 138 05/17/2019 2215   K 3.3 (L) 05/17/2019 2215   CL 105 05/17/2019 2215   CO2 23 05/17/2019 2215   GLUCOSE 169 (H) 05/17/2019 2215   BUN 13 05/17/2019 2215   CREATININE 0.91 05/17/2019 2215   CALCIUM 8.7 (L) 05/17/2019 2215   PROT 7.8 05/17/2019 2215   ALBUMIN 4.2 05/17/2019 2215   AST 24 05/17/2019 2215   ALT 19 05/17/2019 2215   ALKPHOS 54 05/17/2019 2215   BILITOT 0.7 05/17/2019 2215   GFRNONAA >60 05/17/2019 2215   GFRAA >60 05/17/2019 2215   Lab Results  Component Value Date   CHOL 120 10/20/2018   HDL 52.30 10/20/2018   LDLCALC 60 10/20/2018   TRIG 37.0 10/20/2018   CHOLHDL 2 10/20/2018       ASSESSMENT AND PLAN  Abnormal brain MRI - Plan: DG FL GUIDED LUMBAR PUNCTURE, Pan-ANCA  Diplopia - Plan: DG FL GUIDED LUMBAR PUNCTURE  Balance disorder - Plan: DG FL GUIDED LUMBAR PUNCTURE  Low vitamin D level - Plan: VITAMIN D 25 Hydroxy (Vit-D Deficiency, Fractures)   In summary, Ms. Longwell is a 44 year old woman who had an episode of diplopia, vertigo and off-balanceogait 05/17/2019.  The diplopia and  vertigo have improved but she still feels her gait is off balance.  The MRI does show a focus in the  posterior left pons.  Additionally, there are a couple small T2/FLAIR hyperintense foci in the hemispheres, one of the periventricular white matter and the rest being more nonspecific.Marland Kitchen  Characteristics of the dominant focus are more consistent with demyelination than with stroke.  However, there was not significant enhancement which would be unusual for asymptomatic lesion of that size due to MS.  She does not quite meet the McDonald criteria for diagnosis.  I discussed with her that we need to check a lumbar puncture to obtain CSF to determine if she has oligoclonal bands consistent with MS.  If this is present, then she meets criteria for diagnosis and I would recommend that we begin a disease modifying therapy.  If the lumbar puncture is negative, I would still want to do a follow-up MRI in 4 to 6 months to determine if there has been additional changes.  I will also check ANCA at the possible MS mimic.  The brain also showed a large ventricles.  This could be congenital as it appears asymptomatic.  She will return to see me in 1 month or sooner if there are new or worsening neurologic symptoms.  We will let her know the results of the lumbar puncture after we get them.    Thank you for asking me to see Ms. Faerber.  Please let me know if I can be of further assistance with her or other patients in the future.  Panayiotis Rainville A. Felecia Shelling, MD, [facility] [id number], [id number] PM Certified in Neurology, Clinical Neurophysiology, Sleep Medicine and Neuroimaging  [facility] [address], [place], [place] [address] [phone]

## 2019-05-25 ENCOUNTER — Ambulatory Visit: Payer: BLUE CROSS/BLUE SHIELD | Admitting: Neurology

## 2019-05-25 NOTE — Telephone Encounter (Signed)
Received fax from [facility] that [place] denied. Only approved for dx of: narcolepsy, shift work disorder, OSA, MS related fatigue.  Patient can use goodrx coupon to fill at [facility] for 23.95, [facility] for 26.66, [facility] for 23.49 or [facility] for 31.46.

## 2019-05-26 ENCOUNTER — Encounter: Payer: Self-pay | Admitting: Primary Care

## 2019-05-26 ENCOUNTER — Other Ambulatory Visit: Payer: Self-pay

## 2019-05-26 ENCOUNTER — Ambulatory Visit (INDEPENDENT_AMBULATORY_CARE_PROVIDER_SITE_OTHER): Payer: BLUE CROSS/BLUE SHIELD | Admitting: Primary Care

## 2019-05-26 VITALS — BP 118/82 | HR 76 | Temp 96.2°F | Ht 67.0 in | Wt 233.5 lb

## 2019-05-26 DIAGNOSIS — R7989 Other specified abnormal findings of blood chemistry: Secondary | ICD-10-CM

## 2019-05-26 DIAGNOSIS — H532 Diplopia: Secondary | ICD-10-CM | POA: Diagnosis not present

## 2019-05-26 DIAGNOSIS — R9089 Other abnormal findings on diagnostic imaging of central nervous system: Secondary | ICD-10-CM

## 2019-05-26 DIAGNOSIS — E559 Vitamin D deficiency, unspecified: Secondary | ICD-10-CM | POA: Diagnosis not present

## 2019-05-26 DIAGNOSIS — G4710 Hypersomnia, unspecified: Secondary | ICD-10-CM | POA: Diagnosis not present

## 2019-05-26 DIAGNOSIS — R2689 Other abnormalities of gait and mobility: Secondary | ICD-10-CM

## 2019-05-26 MED ORDER — VITAMIN D (ERGOCALCIFEROL) 1.25 MG (50000 UNIT) PO CAPS: ORAL_CAPSULE | ORAL | 0 refills | Status: DC

## 2019-05-26 NOTE — Assessment & Plan Note (Signed)
MRI suggestive of demyelination rather than stroke. Work up and notes in ED reviewed. Notes from recent neurology visit reviewed.  She is pending lumbar puncture for CSF evaluation. She will update.

## 2019-05-26 NOTE — Telephone Encounter (Signed)
Angel Reilly, can you print this and put it on my desk?

## 2019-05-26 NOTE — Progress Notes (Signed)
Subjective:    Patient ID: Angel Reilly, female    DOB: Oct 12, 1975, 44 y.o.   MRN: [id number]  HPI  This visit occurred during the SARS-CoV-2 public health emergency.  Safety protocols were in place, including screening questions prior to the visit, additional usage of staff PPE, and extensive cleaning of exam room while observing appropriate contact time as indicated for disinfecting solutions.   Angel Reilly is a 44 year old female with a history of carpal tunnel syndrome, anxiety and depression, multiple sclerosis (recently diagnosed) who presents today to discuss multiple sclerosis.   She presented to [place] on 05/18/19 with reports of dizziness, nausea, and diplopia. CT head was concerning for possible venriculomegaly. She was sent to [facility] for MRI which showed that she had an acute stroke. CT angio of the head and neck completed. Neurology consulted and suspected more MS rather than acute stroke. She was treated with steroids, underwent MRI cervical spine, and treated with 1250 mg of predisone for the following two days. She was referred to outpatient neurology.  Since her ED visit she was evaluated by outpatient neurology on 05/24/19. Neurology reviewed MRI and CT's and agreed that they were more consistent of demyelination than stroke, given lack of enhancement she didn't meet criteria for diagnosis. Plan is to complete a lumbar puncture to obtain CSF for evidence of oligoclonal bands consistent for MS. If this testing is positive then treatment for MS will begin, if negative then repeat MRI in 4-6 months was recommended. Labs were drawn for ANCA which are pending. Vitamin D level was very low at 9.  She completed her prednisone course and is feeling somewhat better. She feels as though she's "retatinin fluids above knees" to the thighs". She continues to experience fatigue throughout her entire day, hypersomnia, also has trouble with memory, cannot taste anything,  right sided weakness and numbness. She is frustrated with her fatigue and would like to start Provigil as soon as possible, but her insurance company will not cover this without a diagnosis of MS.   She has not been treated for her low vitamin D as of yet. She is due for follow up with neurology in mid June.  BP Readings from Last 3 Encounters:  05/26/19 118/82  05/24/19 122/74  05/18/19 139/83     Review of Systems  Constitutional: Positive for fatigue.  Neurological: Positive for numbness. Negative for weakness.       See HPI  Psychiatric/Behavioral: Positive for sleep disturbance.       Past Medical History:  Diagnosis Date  . Abnormal Pap smear of cervix   . History of cervical dysplasia 01/15/2017  . Pap smear abnormality of cervix/human papillomavirus (HPV) positive   . Post partum depression      Social History   Socioeconomic History  . Marital status: Single    Spouse name: Not on file  . Number of children: 1  . Years of education: Not on file  . Highest education level: Not on file  Occupational History  . Not on file  Tobacco Use  . Smoking status: Never Smoker  . Smokeless tobacco: Never Used  Substance and Sexual Activity  . Alcohol use: Yes    Comment: wine 1-2 per month  . Drug use: No  . Sexual activity: Yes    Birth control/protection: None  Other Topics Concern  . Not on file  Social History Narrative   Caffeine use: Soda daily   Single.  1 child.   Works as an Administrator.    Scientist, physiological Strain:   . Difficulty of Paying Living Expenses:   Food Insecurity:   . Worried About Charity fundraiser in the Last Year:   . Arboriculturist in the Last Year:   Transportation Needs:   . Film/video editor (Medical):   Marland Kitchen Lack of Transportation (Non-Medical):   Physical Activity:   . Days of Exercise per Week:   . Minutes of Exercise per Session:   Stress:   . Feeling of Stress :   Social Connections:     . Frequency of Communication with Friends and Family:   . Frequency of Social Gatherings with Friends and Family:   . Attends Religious Services:   . Active Member of Clubs or Organizations:   . Attends Archivist Meetings:   Marland Kitchen Marital Status:   Intimate Partner Violence:   . Fear of Current or Ex-Partner:   . Emotionally Abused:   Marland Kitchen Physically Abused:   . Sexually Abused:     Past Surgical History:  Procedure Laterality Date  . BREAST BIOPSY Left 2000   benign results  . BREAST BIOPSY Left 2018   us/ bx/clip-neg fibradenoma  . DILATION AND CURETTAGE OF UTERUS    . WISDOM TOOTH EXTRACTION      Family History  Problem Relation Age of Onset  . Breast cancer Maternal Aunt        mid 66's  . Colon cancer Maternal Aunt   . Depression Mother   . Hypertension Mother   . Heart disease Father   . Heart attack Father   . Hypertension Father   . Depression Brother   . Heart disease Maternal Grandmother   . Hypertension Maternal Grandmother     No Known Allergies  Current Outpatient Medications on File Prior to Visit  Medication Sig Dispense Refill  . modafinil (PROVIGIL) 200 MG tablet Take 1 tablet (200 mg total) by mouth daily. 30 tablet 3   No current facility-administered medications on file prior to visit.    BP 118/82   Pulse 76   Temp (!) 96.2 F (35.7 C) (Temporal)   Ht 5\' 7"  (1.702 m)   Wt 233 lb 8 oz (105.9 kg)   LMP 05/17/2019   SpO2 97%   BMI 36.57 kg/m    Objective:   Physical Exam  Constitutional: She is oriented to person, place, and time. She appears well-nourished.  Eyes: EOM are normal.  Cardiovascular: Normal rate and regular rhythm.  Respiratory: Effort normal and breath sounds normal.  Musculoskeletal:     Comments: 5/5 strength to bilateral upper and lower extremities. Ambulates well in clinic.  Neurological: She is alert and oriented to person, place, and time. No cranial nerve deficit. Coordination normal.  Skin: Skin is warm  and dry.           Assessment & Plan:

## 2019-05-26 NOTE — Patient Instructions (Signed)
Start vitamin D capsules. Take 1 capsule by mouth once weekly for a minimum of 12 weeks.  Vitamin D levels need to be rechecked in 12 weeks.  Follow up with neurology as scheduled.  It was a pleasure to see you today!

## 2019-05-26 NOTE — Assessment & Plan Note (Signed)
Very low level of 9 on recent labs. Rx provided for 50,000 units once weekly for a minimum of 12 weeks. Repeat vitamin D lab in 12 weeks.

## 2019-05-26 NOTE — Assessment & Plan Note (Signed)
Suspected to be secondary to MS but no formal diagnosis as of yet. Not currently taking Provigil, awaitinf formal diagnosis.

## 2019-05-29 ENCOUNTER — Telehealth: Payer: Self-pay | Admitting: *Deleted

## 2019-05-29 ENCOUNTER — Other Ambulatory Visit: Payer: Self-pay | Admitting: *Deleted

## 2019-05-29 DIAGNOSIS — R2689 Other abnormalities of gait and mobility: Secondary | ICD-10-CM

## 2019-05-29 DIAGNOSIS — H532 Diplopia: Secondary | ICD-10-CM

## 2019-05-29 DIAGNOSIS — E559 Vitamin D deficiency, unspecified: Secondary | ICD-10-CM

## 2019-05-29 DIAGNOSIS — R9389 Abnormal findings on diagnostic imaging of other specified body structures: Secondary | ICD-10-CM

## 2019-05-29 LAB — PAN-ANCA
ANCA Proteinase 3: <3.5 U/mL (ref 0.0–3.5)
Atypical pANCA: <1:20 {titer}
C-ANCA: <1:20 {titer}
Myeloperoxidase Ab: <9 U/mL (ref 0.0–9.0)
P-ANCA: <1:20 {titer}

## 2019-05-29 LAB — VITAMIN D 25 HYDROXY (VIT D DEFICIENCY, FRACTURES): Vit D, 25-Hydroxy: 9.4 ng/mL — ABNORMAL LOW (ref 30.0–100.0)

## 2019-05-29 MED ORDER — VITAMIN D (ERGOCALCIFEROL) 1.25 MG (50000 UNIT) PO CAPS
50000.0000 [IU] | ORAL_CAPSULE | ORAL | 1 refills | Status: DC
Start: 2019-05-29 — End: 2022-10-29

## 2019-05-29 NOTE — Telephone Encounter (Signed)
-----   Message from Britt Bottom, MD sent at 05/29/2019 10:03 AM EDT ----- The vitamin D was very low.  Let us call in 50,000 units #13 with 3 refills to take weekly

## 2019-05-29 NOTE — Telephone Encounter (Signed)
Angel C. Has LVM for Angel Reilly at [facility] to see if we can order LP with sedation for pt. She is waiting on a call back.

## 2019-05-29 NOTE — Telephone Encounter (Signed)
Printed

## 2019-06-01 NOTE — Telephone Encounter (Signed)
[email or web address] is asking for a call to discuss why sedation has been requested for LP, Caryl Pina stated LP's are not normally done with sedation.  Please call her at [phone]

## 2019-06-01 NOTE — Telephone Encounter (Signed)
Called Angel Reilly back to inform her that the reason sedation was order is because pt has high anxiety, needle phobic and does not feel oral meds will allow her to get through procedure. She verbalized understanding. She will f/u with pt to get her scheduled.

## 2019-06-01 NOTE — Telephone Encounter (Signed)
Called [facility]. I got transferred to interventional radiology department. Spoke with Myriam Jacobson (NP). She took pt info and is going to reach out to someone to see about getting pt set up for LP w/ sedation. She will call back at [phone]. Aware we are closing early at 12pm today. Advised order placed for this on 05/29/19. She verbalized understanding.

## 2019-06-06 ENCOUNTER — Telehealth (HOSPITAL_COMMUNITY): Payer: Self-pay

## 2019-06-06 NOTE — Telephone Encounter (Signed)
-----   Message from Arne Cleveland, MD sent at 06/06/2019 11:29 AM EDT ----- Regarding: RE: Lumbar Puncture w/sedation That is fine. We can to that.  Thx DDH   ----- Message ----- From: Danielle Dess Sent: 06/06/2019  11:13 AM EDT To: Arne Cleveland, MD Subject: Lumbar Puncture w/sedation                     Vernard Gambles,   Dr. Garth Bigness office called requesting this patient's LP to be done with moderate sedation. The pt is terrified of needles and has severe anxiety. Can we do this with sedation? They had her scheduled at GI but she refuses to do it without sedation and has been referred to the hospital for procedure.   Dx: Abnormal brain MRI (in epic), Diplopia, Balance disorder  Ordering: Dr. Arlice Colt ([phone])  Thanks, Lia Foyer

## 2019-06-09 ENCOUNTER — Other Ambulatory Visit: Payer: BLUE CROSS/BLUE SHIELD

## 2019-06-15 ENCOUNTER — Telehealth: Payer: Self-pay | Admitting: Neurology

## 2019-06-15 DIAGNOSIS — R29898 Other symptoms and signs involving the musculoskeletal system: Secondary | ICD-10-CM

## 2019-06-15 DIAGNOSIS — R2689 Other abnormalities of gait and mobility: Secondary | ICD-10-CM

## 2019-06-15 DIAGNOSIS — F32A Depression, unspecified: Secondary | ICD-10-CM

## 2019-06-15 DIAGNOSIS — F419 Anxiety disorder, unspecified: Secondary | ICD-10-CM

## 2019-06-15 NOTE — Telephone Encounter (Signed)
Called pt back and scheduled work in visit for 07/03/19 at 1:30pm with Dr. Felecia Shelling. Asked that she check in by 1:00pm. Cx appt that was previously scheduled for 06/20/19.

## 2019-06-15 NOTE — Telephone Encounter (Signed)
Pt called stating that she is needing to cancel her appt that she has on the 15th due to having a [employer] scheduled that day. Pt is needing to r/s but next appt is not till September and she is needing to be seen around 3 weeks. Please advise.

## 2019-06-19 ENCOUNTER — Other Ambulatory Visit: Payer: Self-pay | Admitting: Student

## 2019-06-20 ENCOUNTER — Ambulatory Visit: Payer: BLUE CROSS/BLUE SHIELD | Admitting: Neurology

## 2019-06-20 ENCOUNTER — Ambulatory Visit (HOSPITAL_COMMUNITY)
Admission: RE | Admit: 2019-06-20 | Discharge: 2019-06-20 | Disposition: A | Discharge status: Home / Self Care | Payer: BLUE CROSS/BLUE SHIELD | Source: Ambulatory Visit | Attending: Neurology | Admitting: Neurology

## 2019-06-20 ENCOUNTER — Other Ambulatory Visit: Payer: Self-pay

## 2019-06-20 ENCOUNTER — Other Ambulatory Visit: Payer: Self-pay | Admitting: Neurology

## 2019-06-20 DIAGNOSIS — R2689 Other abnormalities of gait and mobility: Secondary | ICD-10-CM

## 2019-06-20 DIAGNOSIS — H532 Diplopia: Secondary | ICD-10-CM | POA: Diagnosis not present

## 2019-06-20 DIAGNOSIS — Z8619 Personal history of other infectious and parasitic diseases: Secondary | ICD-10-CM | POA: Diagnosis not present

## 2019-06-20 DIAGNOSIS — R9389 Abnormal findings on diagnostic imaging of other specified body structures: Secondary | ICD-10-CM | POA: Diagnosis present

## 2019-06-20 LAB — CSF CELL COUNT WITH DIFFERENTIAL
RBC Count, CSF: 0 /mm3
Tube #: 3
WBC, CSF: 2 /mm3 (ref 0–5)

## 2019-06-20 LAB — GLUCOSE, CSF: Glucose, CSF: 56 mg/dL (ref 40–70)

## 2019-06-20 LAB — PROTEIN, CSF: Total  Protein, CSF: 25 mg/dL (ref 15–45)

## 2019-06-20 MED ORDER — FENTANYL CITRATE (PF) 100 MCG/2ML IJ SOLN
INTRAMUSCULAR | Status: AC | PRN
  Administered 2019-06-20 (×2): 50 ug via INTRAVENOUS

## 2019-06-20 MED ORDER — LIDOCAINE HCL 1 % IJ SOLN
INTRAMUSCULAR | Status: AC
  Filled 2019-06-20: qty 20

## 2019-06-20 MED ORDER — ACETAMINOPHEN 325 MG PO TABS: 650.0000 mg | ORAL_TABLET | ORAL | Status: DC | PRN

## 2019-06-20 MED ORDER — LIDOCAINE HCL (PF) 1 % IJ SOLN
INTRAMUSCULAR | Status: AC | PRN
  Administered 2019-06-20: 5 mL

## 2019-06-20 MED ORDER — MIDAZOLAM HCL 2 MG/2ML IJ SOLN
INTRAMUSCULAR | Status: AC
  Filled 2019-06-20: qty 2

## 2019-06-20 MED ORDER — MIDAZOLAM HCL 2 MG/2ML IJ SOLN
INTRAMUSCULAR | Status: AC | PRN
  Administered 2019-06-20 (×2): 1 mg via INTRAVENOUS

## 2019-06-20 MED ORDER — LIDOCAINE HCL 1 % IJ SOLN
INTRAMUSCULAR | Status: AC | PRN
  Administered 2019-06-20: 5 mL

## 2019-06-20 MED ORDER — SODIUM CHLORIDE 0.9 % IV SOLN: INTRAVENOUS | Status: DC

## 2019-06-20 MED ORDER — FENTANYL CITRATE (PF) 100 MCG/2ML IJ SOLN
INTRAMUSCULAR | Status: AC
  Filled 2019-06-20: qty 2

## 2019-06-20 NOTE — Discharge Instructions (Signed)
Lumbar Puncture, Care After This sheet gives you information about how to care for yourself after your procedure. Your health care provider may also give you more specific instructions. If you have problems or questions, contact your health care provider. What can I expect after the procedure? After the procedure, it is common to have:  Mild discomfort or pain at the puncture site.  A mild headache that is relieved with pain medicines. Follow these instructions at home: Activity   Lie down flat or rest for as long as directed by your health care provider.  Return to your normal activities as told by your health care provider. Ask your health care provider what activities are safe for you.  Avoid lifting anything heavier than 10 lb (4.5 kg) for at least 12 hours after the procedure.  Do not drive for 24 hours if you were given a medicine to help you relax (sedative) during your procedure.  Do not drive or use heavy machinery while taking prescription pain medicine. Puncture site care  Remove or change your bandage (dressing) as told by your health care provider.  Check your puncture area every day for signs of infection. Check for: ? More pain. ? Redness or swelling. ? Fluid or blood leaking from the puncture site. ? Warmth. ? Pus or a bad smell. General instructions  Take over-the-counter and prescription medicines only as told by your health care provider.  Drink enough fluids to keep your urine clear or pale yellow. Your health care provider may recommend drinking caffeine to prevent a headache.  Keep all follow-up visits as told by your health care provider. This is important. Contact a health care provider if:  You have fever or chills.  You have nausea or vomiting.  You have a headache that lasts for more than 2 days or does not get better with medicine. Get help right away if:  You develop any of the following in your  legs: ? Weakness. ? Numbness. ? Tingling.  You are unable to control when you urinate or have a bowel movement (incontinence).  You have signs of infection around your puncture site, such as: ? More pain. ? Redness or swelling. ? Fluid or blood leakage. ? Warmth. ? Pus or a bad smell.  You are dizzy or you feel like you might faint.  You have a severe headache, especially when you sit or stand. Summary  A lumbar puncture is a procedure in which a small needle is inserted into the lower back to remove fluid that surrounds the brain and spinal cord.  After this procedure, it is common to have a headache and pain around the needle insertion area.  Lying flat, staying hydrated, and drinking caffeine can help prevent headaches.  Monitor your needle insertion site for signs of infection, including warmth, fluid, or more pain.  Get help right away if you develop leg weakness, leg numbness, incontinence, or severe headaches. This information is not intended to replace advice given to you by your health care provider. Make sure you discuss any questions you have with your health care provider. Document Revised: 02/05/2016 Document Reviewed: 02/05/2016 Elsevier Patient Education  2020 [employer]  

## 2019-06-20 NOTE — Procedures (Signed)
  Procedure: Lumbar puncture under fluoro   EBL:   minimal Complications:  none immediate  See full dictation in [facility].  Dillard Cannon MD Main # [phone] Pager  [phone]

## 2019-06-20 NOTE — Progress Notes (Signed)
Patient and mother was given discharge instructions. Both verbalized understanding. 

## 2019-06-20 NOTE — H&P (Signed)
Chief Complaint: Patient was seen in consultation today for diplopia and balance difficulties/lumbar puncture.  Referring Physician(s): Sater,Richard A  Supervising Physician: Arne Cleveland  Patient Status: [facility] - Out-pt  History of Present Illness: Angel Reilly is a 44 y.o. female with a past medical history of HPV and post-partum depression. She presented to [facility] ED 05/17/2019 due to an episode of vomiting, diplopia, and balance difficulties. CT head revealed no acute findings. MR brain revealed a dorsal pontine focus on the left that had DWI restriction. She was discharged home with outpatient neurology follow-up. She met with Dr. Felecia Shelling for further management, who recommended IR consultation for possible lumbar puncture to rule-out MS as cause of diplopia/balance issues/abnormal MRI.  IR requested by Dr. Felecia Shelling for possible image-guided lumbar puncture. Patient awake and alert sitting in bed. Complains of intermittent diplopia and balance issues. Denies fever, chills, chest pain, dyspnea, abdominal pain, or headache.   Past Medical History:  Diagnosis Date  . Abnormal Pap smear of cervix   . History of cervical dysplasia 01/15/2017  . Pap smear abnormality of cervix/human papillomavirus (HPV) positive   . Post partum depression     Past Surgical History:  Procedure Laterality Date  . BREAST BIOPSY Left 2000   benign results  . BREAST BIOPSY Left 2018   us/ bx/clip-neg fibradenoma  . DILATION AND CURETTAGE OF UTERUS    . WISDOM TOOTH EXTRACTION      Allergies: Patient has no known allergies.  Medications: Prior to Admission medications   Medication Sig Start Date End Date Taking? Authorizing Provider  Multiple Vitamins-Minerals (MULTIVITAMIN WITH MINERALS) tablet Take 1 tablet by mouth daily.   Yes [provider]  Vitamin D, Ergocalciferol, (DRISDOL) 1.25 MG (50000 UNIT) CAPS capsule Take 1 capsule (50,000 Units total) by mouth every 7  (seven) days. 05/29/19  Yes Sater, Nanine Means, MD     Family History  Problem Relation Age of Onset  . Breast cancer Maternal Aunt        mid 53's  . Colon cancer Maternal Aunt   . Depression Mother   . Hypertension Mother   . Heart disease Father   . Heart attack Father   . Hypertension Father   . Depression Brother   . Heart disease Maternal Grandmother   . Hypertension Maternal Grandmother     Social History   Socioeconomic History  . Marital status: Single    Spouse name: Not on file  . Number of children: 1  . Years of education: Not on file  . Highest education level: Not on file  Occupational History  . Not on file  Tobacco Use  . Smoking status: Never Smoker  . Smokeless tobacco: Never Used  Vaping Use  . Vaping Use: Never used  Substance and Sexual Activity  . Alcohol use: Yes    Comment: wine 1-2 per month  . Drug use: No  . Sexual activity: Yes    Birth control/protection: None  Other Topics Concern  . Not on file  Social History Narrative   Caffeine use: Soda daily   Single.   1 child.   Works as an Administrator.    Scientist, physiological Strain:   . Difficulty of Paying Living Expenses:   Food Insecurity:   . Worried About Charity fundraiser in the Last Year:   . Arboriculturist in the Last Year:   Transportation Needs:   . Lack  of Transportation (Medical):   Marland Kitchen Lack of Transportation (Non-Medical):   Physical Activity:   . Days of Exercise per Week:   . Minutes of Exercise per Session:   Stress:   . Feeling of Stress :   Social Connections:   . Frequency of Communication with Friends and Family:   . Frequency of Social Gatherings with Friends and Family:   . Attends Religious Services:   . Active Member of Clubs or Organizations:   . Attends Archivist Meetings:   Marland Kitchen Marital Status:      Review of Systems: A 12 point ROS discussed and pertinent positives are indicated in the HPI above.  All other  systems are negative.  Review of Systems  Constitutional: Negative for chills and fever.  Eyes: Positive for visual disturbance.  Respiratory: Negative for shortness of breath and wheezing.   Cardiovascular: Negative for chest pain and palpitations.  Gastrointestinal: Negative for abdominal pain.  Musculoskeletal: Positive for gait problem.  Neurological: Negative for headaches.  Psychiatric/Behavioral: Negative for behavioral problems and confusion.    Vital Signs: BP 115/77   Pulse 74   Temp 97.6 F (36.4 C) (Skin)   Resp 15   Ht _0  (1.702 m)   Wt 223 lb (101.2 kg)   SpO2 99%   BMI 34.93 kg/m   Physical Exam Vitals and nursing note reviewed.  Constitutional:      General: She is not in acute distress.    Appearance: Normal appearance.  Cardiovascular:     Rate and Rhythm: Normal rate and regular rhythm.     Heart sounds: Normal heart sounds. No murmur heard.   Pulmonary:     Effort: Pulmonary effort is normal. No respiratory distress.     Breath sounds: Normal breath sounds. No wheezing.  Skin:    General: Skin is warm and dry.  Neurological:     Mental Status: She is alert and oriented to person, place, and time.      MD Evaluation Airway: WNL Heart: WNL Abdomen: WNL Chest/ Lungs: WNL ASA  Classification: 1 Mallampati/Airway Score: Two   Imaging: No results found.  Labs:  CBC: Recent Labs    05/17/19 2215  WBC 9.3  HGB 12.4  HCT 36.2  PLT 236    COAGS: No results for input(s): INR, APTT in the last 8760 hours.  BMP: Recent Labs    10/20/18 1541 05/17/19 2215  NA 139 138  K 3.8 3.3*  CL 105 105  CO2 27 23  GLUCOSE 77 169*  BUN 11 13  CALCIUM 9.4 8.7*  CREATININE 0.91 0.91  GFRNONAA  --  >60  GFRAA  --  >60    LIVER FUNCTION TESTS: Recent Labs    10/20/18 1541 05/17/19 2215  BILITOT 0.8 0.7  AST 13 24  ALT 10 19  ALKPHOS 51 54  PROT 7.7 7.8  ALBUMIN 4.5 4.2     Assessment and Plan:  Diplopia, balance  difficulties, MR brain results concerning for MS. Plan for image-guided lumbar puncture today in IR. Patient is NPO. Afebrile.  Risks and benefits discussed with the patient including, but not limited to bleeding, infection, damage to adjacent structures or low yield requiring additional tests. All of the patient's questions were answered, patient is agreeable to proceed. Consent signed and in chart.   Thank you for this interesting consult.  I greatly enjoyed meeting Angel Reilly and look forward to participating in their care.  A copy of this  report was sent to the requesting provider on this date.  Electronically Signed: Earley Abide, PA-C 06/20/2019, 1:13 PM   I spent a total of 30 Minutes in face to face in clinical consultation, greater than 50% of which was counseling/coordinating care for diplopia and balance difficulties/lumbar puncture.

## 2019-06-21 ENCOUNTER — Other Ambulatory Visit: Payer: Self-pay | Admitting: Neurology

## 2019-06-21 ENCOUNTER — Telehealth: Payer: Self-pay

## 2019-06-21 ENCOUNTER — Encounter (HOSPITAL_COMMUNITY): Payer: Self-pay

## 2019-06-21 DIAGNOSIS — R2689 Other abnormalities of gait and mobility: Secondary | ICD-10-CM

## 2019-06-21 DIAGNOSIS — R9389 Abnormal findings on diagnostic imaging of other specified body structures: Secondary | ICD-10-CM

## 2019-06-21 DIAGNOSIS — R9089 Other abnormal findings on diagnostic imaging of central nervous system: Secondary | ICD-10-CM

## 2019-06-21 DIAGNOSIS — H532 Diplopia: Secondary | ICD-10-CM

## 2019-06-21 LAB — VDRL, CSF: VDRL Quant, CSF: NONREACTIVE

## 2019-06-21 NOTE — Telephone Encounter (Signed)
Pt called me back and is agreeable to coming in to our office today for her blood draw. Pt wanted to know if she should take any preventive meds to help ward off any h/a due to the LP completed yesterday?  Pt will come after lunch for blood draw to be done.

## 2019-06-21 NOTE — Telephone Encounter (Signed)
(  I placed orders a few minutes ago for our lab but just saw this call)  we can re-order the LP at [facility] under sedation and need to make sure that the Oligoclonal bands and IgG Index gets done

## 2019-06-21 NOTE — Telephone Encounter (Signed)
I called [facility] and they confirmed no serum was drawn during the LP yesterday. I have called the pt asking her to call me back ASAP so we can discuss her coming in to our office to have serum drawn. Waiting on call back.

## 2019-06-21 NOTE — Telephone Encounter (Signed)
Pt called me back and sts she would rather repeat the LP at this time. Pt sts she is extremely worried about post LP h/a/complications if she does not lay flat today.  Pt is agreeable to order being sent to [facility] again for the procedure to be complete. She wanted to know how long she would need to wait before repeating the LP? I advised I would check with Dr. Felecia Shelling and get back with her.

## 2019-06-21 NOTE — Telephone Encounter (Signed)
-----   Message from Britt Bottom, MD sent at 06/21/2019  8:35 AM EDT ----- Regarding: LP Could you please check with the lab.  She had a lumbar puncture yesterday.  It looks like they did not draw serum for comparison to the CSF so they cannot do the oligoclonal bands (the main purpose we did not the lumbar puncture)  Is there any way that they can fix this (i.e. can they draw the the serum today for comparison) so that the patient does not need another lumbar puncture

## 2019-06-21 NOTE — Telephone Encounter (Addendum)
I have reached out interventional radiology department and they confirmed serum was not drawn for the banding during yesterday's LP. I spoke with lab tech at [facility] and she confirmed the pt could come tomorrow for the serum blood test to be drawn. Lab tech advised the pt would go to admitting and be registered for the lab and then blood test will be completed. Lab tech confirmed the specimen from LP and blood draw specimen will be processed from the same location.  I have faxed the order to [facility] lab per request to # [phone] and Dr. Felecia Shelling has also placed order in epic.  I have called the pt back and advised [facility] can accommodate the blood draw tomorrow and to report to [facility] admitting. Pt verbalized understanding.   I have called [facility] and requested a note be put on CSF fluid that pt will have serum drawn tomorrow for banding result to be completed. Katrise with [facility] stated note has been placed.

## 2019-06-22 ENCOUNTER — Other Ambulatory Visit (HOSPITAL_COMMUNITY)
Admission: RE | Admit: 2019-06-22 | Discharge: 2019-06-22 | Disposition: A | Discharge status: Home / Self Care | Payer: BLUE CROSS/BLUE SHIELD | Source: Other Acute Inpatient Hospital | Attending: Neurology | Admitting: Neurology

## 2019-06-22 DIAGNOSIS — R2689 Other abnormalities of gait and mobility: Secondary | ICD-10-CM | POA: Diagnosis not present

## 2019-06-22 DIAGNOSIS — H532 Diplopia: Secondary | ICD-10-CM | POA: Diagnosis not present

## 2019-06-22 DIAGNOSIS — R9389 Abnormal findings on diagnostic imaging of other specified body structures: Secondary | ICD-10-CM | POA: Diagnosis not present

## 2019-06-23 ENCOUNTER — Other Ambulatory Visit: Payer: BLUE CROSS/BLUE SHIELD

## 2019-06-23 ENCOUNTER — Other Ambulatory Visit: Payer: Self-pay | Admitting: Neurology

## 2019-06-23 LAB — IGG CSF INDEX
Albumin CSF-mCnc: 11 mg/dL (ref 8–37)
Albumin: 4.1 g/dL (ref 3.8–4.8)
CSF IgG Index: 2 — ABNORMAL HIGH (ref 0.0–0.7)
IgG (Immunoglobin G), Serum: 1339 mg/dL (ref 586–1602)
IgG, CSF: 7.3 mg/dL — ABNORMAL HIGH (ref 0.0–6.7)
IgG/Alb Ratio, CSF: 0.66 — ABNORMAL HIGH (ref 0.00–0.25)

## 2019-06-26 LAB — OLIGOCLONAL BANDS, CSF + SERM

## 2019-07-03 ENCOUNTER — Telehealth: Payer: Self-pay

## 2019-07-03 ENCOUNTER — Encounter: Payer: Self-pay | Admitting: Neurology

## 2019-07-03 ENCOUNTER — Ambulatory Visit: Payer: BLUE CROSS/BLUE SHIELD | Admitting: Neurology

## 2019-07-03 ENCOUNTER — Other Ambulatory Visit: Payer: Self-pay

## 2019-07-03 VITALS — BP 120/90 | HR 80 | Ht 67.0 in | Wt 244.0 lb

## 2019-07-03 DIAGNOSIS — R2689 Other abnormalities of gait and mobility: Secondary | ICD-10-CM

## 2019-07-03 DIAGNOSIS — G35 Multiple sclerosis: Secondary | ICD-10-CM | POA: Diagnosis not present

## 2019-07-03 DIAGNOSIS — G4710 Hypersomnia, unspecified: Secondary | ICD-10-CM

## 2019-07-03 DIAGNOSIS — H532 Diplopia: Secondary | ICD-10-CM

## 2019-07-03 DIAGNOSIS — Z79899 Other long term (current) drug therapy: Secondary | ICD-10-CM

## 2019-07-03 DIAGNOSIS — E559 Vitamin D deficiency, unspecified: Secondary | ICD-10-CM

## 2019-07-03 MED ORDER — MODAFINIL 200 MG PO TABS: 200.0000 mg | ORAL_TABLET | Freq: Every day | ORAL | 5 refills | Status: DC

## 2019-07-03 NOTE — Progress Notes (Signed)
[facility]  PATIENT: Angel Reilly DOB: [birth year]  REFERRING DOCTOR OR PCP: Alma Friendly, NP SOURCE: Patient, notes from primary care, notes from [facility] emergency room, imaging and lab reports, MRI images personally reviewed.  _________________________________   HISTORICAL  CHIEF COMPLAINT:  Chief Complaint  Patient presents with  . Follow-up    RM 35, with daughter. Discuss tx option and dx. Wants to discuss her weight gain. Wants DMV handicap placard.    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  Update 07/03/2019: Since the last visit, she had an LP.  CSF showed greater than 5 oligoclonal bands.  She also had an elevated IgG index.  The combination of her symptoms, MRI and CSF findings allow [place] to diagnose her with clinically definite MS using the McDonald criteria.    Her last exacerbation involved a brainstem lesion.  This may place her at some additional risk of aggressiveness.  I discussed this with her and the need to begin a disease modifying therapy.  She has given this some thought already and was most interested in [place].  I went over the risks and benefits of this medication.  We discussed that it may be associated with a higher risk of infection such as pneumonia.  It may also be associated with a higher risk of breast cancer.  She understands these risks and would like to go on this medication.  She also reports more fatigue and sleepiness.  This could be related to [place] or other issues..  She has no recent thyroid panel.  Additionally she reports some edema in her legs.  She has gained some weight.  Vitamin D was very low (9.4).  We have sent in a prescription for high-dose vitamin D.  MS History:   She had the onset of vomiting and poor balance and diplopia 05/17/19.   A CT scan was performed that was read as showing no acute findings.    Due to the diplopia, an MRI of  the brain was also performed.   The MRI of the brain showed a dorsal pontine focus on the left that had some DWI restriction but did not enhance.  Initially, this was felt to represent a stroke and CT angiogram was also performed.  The CT angiogram was normal.  She also notes that she has had numbness in her lips and reduced taste for the past month or so.   She also noted mild right hand numbness for the past week.    She has had mild left ptosis x years.   Sometimes hr toes and right hand lock up.    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      Data: CT scan and MRI of the head and cervical spine from 05/18/2019.  The CT scan showed ventriculomegaly and slight hypodensity in the posterior left pons.  This is confirmed as a T2/flair hyperintense focus on brain MRI.  It did not enhance.  However, it was mildly hyperintense on diffusion-weighted imaging.  Of note, it is not hypointense on the ADC maps, more consistent with inflammation than with ischemia.  Additionally, there are a few punctate T2/FLAIR hyperintense foci in the white matter of the hemispheres that appear nonspecific.  The ventriculomegaly is more pronounced in the left lateral ventricle.  The temporal horns are not enlarged.  There is mild volume loss in the brain as well.  With contrast,  there is 1 tiny spot of enhancement noted only on the sagittal images but not on coronal or axial views there are no older brain images for comparison.    CT angiogram was also reviewed.  There was no significant stenosis noted.  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  CBC and CMP showed mild hypokalemia and was otherwise unremarkable.  Urine tox screen was negative.  SARS-CoV-2 test was negative.  REVIEW OF SYSTEMS: Constitutional: No fevers, chills, sweats, or change in appetite Eyes: No visual changes, double vision, eye pain Ear, nose and throat: No hearing loss, ear pain, nasal congestion, sore  throat Cardiovascular: No chest pain, palpitations Respiratory: No shortness of breath at rest or with exertion.   No wheezes GastrointestinaI: No nausea, vomiting, diarrhea, abdominal pain, fecal incontinence Genitourinary: No dysuria, urinary retention or frequency.  No nocturia. Musculoskeletal: No neck pain, back pain Integumentary: No rash, pruritus, skin lesions Neurological: as above Psychiatric: No depression at this time.  No anxiety Endocrine: No palpitations, diaphoresis, change in appetite, change in weigh or increased thirst Hematologic/Lymphatic: No anemia, purpura, petechiae. Allergic/Immunologic: No itchy/runny eyes, nasal congestion, recent allergic reactions, rashes  ALLERGIES: No Known Allergies  HOME MEDICATIONS:  Current Outpatient Medications:  Marland Kitchen  Multiple Vitamins-Minerals (MULTIVITAMIN WITH MINERALS) tablet, Take 1 tablet by mouth daily., Disp: , Rfl:  .  Vitamin D, Ergocalciferol, (DRISDOL) 1.25 MG (50000 UNIT) CAPS capsule, Take 1 capsule (50,000 Units total) by mouth every 7 (seven) days., Disp: 13 capsule, Rfl: 1 .  modafinil (PROVIGIL) 200 MG tablet, Take 1 tablet (200 mg total) by mouth daily., Disp: 30 tablet, Rfl: 5  PAST MEDICAL HISTORY: Past Medical History:  Diagnosis Date  . Abnormal Pap smear of cervix   . History of cervical dysplasia 01/15/2017  . Pap smear abnormality of cervix/human papillomavirus (HPV) positive   . Post partum depression     PAST SURGICAL HISTORY: Past Surgical History:  Procedure Laterality Date  . BREAST BIOPSY Left 2000   benign results  . BREAST BIOPSY Left 2018   us/ bx/clip-neg fibradenoma  . DILATION AND CURETTAGE OF UTERUS    . IR FL GUIDED LOC OF NEEDLE/CATH TIP FOR SPINAL INJECTION RT  06/20/2019  . WISDOM TOOTH EXTRACTION      FAMILY HISTORY: Family History  Problem Relation Age of Onset  . Breast cancer Maternal Aunt        mid 45's  . Colon cancer Maternal Aunt   . Depression Mother   .  Hypertension Mother   . Heart disease Father   . Heart attack Father   . Hypertension Father   . Depression Brother   . Heart disease Maternal Grandmother   . Hypertension Maternal Grandmother     SOCIAL HISTORY:  Social History   Socioeconomic History  . Marital status: Single    Spouse name: Not on file  . Number of children: 1  . Years of education: Not on file  . Highest education level: Not on file  Occupational History  . Not on file  Tobacco Use  . Smoking status: Never Smoker  . Smokeless tobacco: Never Used  Vaping Use  . Vaping Use: Never used  Substance and Sexual Activity  . Alcohol use: Yes    Comment: wine 1-2 per month  . Drug use: No  . Sexual activity: Yes    Birth control/protection: None  Other Topics Concern  . Not on file  Social History Narrative  Caffeine use: Soda daily   Single.   1 child.   Works as an Administrator.    Scientist, physiological Strain:   . Difficulty of Paying Living Expenses:   Food Insecurity:   . Worried About Charity fundraiser in the Last Year:   . Arboriculturist in the Last Year:   Transportation Needs:   . Film/video editor (Medical):   Marland Kitchen Lack of Transportation (Non-Medical):   Physical Activity:   . Days of Exercise per Week:   . Minutes of Exercise per Session:   Stress:   . Feeling of Stress :   Social Connections:   . Frequency of Communication with Friends and Family:   . Frequency of Social Gatherings with Friends and Family:   . Attends Religious Services:   . Active Member of Clubs or Organizations:   . Attends Archivist Meetings:   Marland Kitchen Marital Status:   Intimate Partner Violence:   . Fear of Current or Ex-Partner:   . Emotionally Abused:   Marland Kitchen Physically Abused:   . Sexually Abused:      PHYSICAL EXAM  Vitals:   07/03/19 1336  BP: 120/90  Pulse: 80  Weight: 244 lb (110.7 kg)  Height: 5\' 7"  (1.702 m)    Body mass index is 38.22  kg/m.   General: The patient is well-developed and well-nourished and in no acute distress  HEENT:  Head is [place]/AT.  Sclera are anicteric.    Neck: Neck has good range of motion.  Skin: Extremities are without rash.  She has very mild pedal edema.  Neurologic Exam  Mental status: The patient is alert and oriented x 3 at the time of the examination. The patient has apparent normal recent and remote memory, with an apparently normal attention span and concentration ability.   Speech is normal.  Cranial nerves: Extraocular movements are full.  Facial strength was normal.  Trapezius and sternocleidomastoid strength is normal. No dysarthria is noted.  No obvious hearing deficits are noted.  Motor:  Muscle bulk is normal.   Tone is normal. Strength is  5 / 5 in all 4 extremities.   Sensory: Intact sensation to touch and vibration.  Coordination: Cerebellar testing reveals good finger-nose-finger and heel-to-shin bilaterally.  Gait and station: Station is normal.   Her gait is mildly wide.    The tandem gait is wide.  Romberg is negative.   Reflexes: Deep tendon reflexes are symmetric and normal bilaterally.        DIAGNOSTIC DATA (LABS, IMAGING, TESTING) - I reviewed patient records, labs, notes, testing and imaging myself where available.  Lab Results  Component Value Date   WBC 9.3 05/17/2019   HGB 12.4 05/17/2019   HCT 36.2 05/17/2019   MCV 92.1 05/17/2019   PLT 236 05/17/2019      Component Value Date/Time   NA 138 05/17/2019 2215   K 3.3 (L) 05/17/2019 2215   CL 105 05/17/2019 2215   CO2 23 05/17/2019 2215   GLUCOSE 169 (H) 05/17/2019 2215   BUN 13 05/17/2019 2215   CREATININE 0.91 05/17/2019 2215   CALCIUM 8.7 (L) 05/17/2019 2215   PROT 7.8 05/17/2019 2215   ALBUMIN 4.1 06/22/2019 1325   AST 24 05/17/2019 2215   ALT 19 05/17/2019 2215   ALKPHOS 54 05/17/2019 2215   BILITOT 0.7 05/17/2019 2215   GFRNONAA >60 05/17/2019 2215   GFRAA >60 05/17/2019 2215   Lab  Results  Component Value Date   CHOL 120 10/20/2018   HDL 52.30 10/20/2018   LDLCALC 60 10/20/2018   TRIG 37.0 10/20/2018   CHOLHDL 2 10/20/2018       ASSESSMENT AND PLAN  Multiple sclerosis ([place]) - Plan: Hepatitis B core antibody, total, Hepatitis B surface antigen, HIV Antibody (routine testing w rflx), QuantiFERON-TB Gold Plus, Hepatitis B surface antibody,qualitative, Stratify JCV Antibody Test ([facility]), Thyroid Panel With TSH  High risk medication use - Plan: Hepatitis B core antibody, total, Hepatitis B surface antigen, HIV Antibody (routine testing w rflx), QuantiFERON-TB Gold Plus, Hepatitis B surface antibody,qualitative, Stratify JCV Antibody Test ([facility]), Thyroid Panel With TSH  Diplopia  Balance disorder  Vitamin D deficiency  Hypersomnia  1.  We discussed disease modifying therapy options.  We will check blood work for [employer].  She signed a service request form.  We will send this in after of the labs return.  The most interested in [place] she was also potentially interested in [place] and I will check the JCV antibody in case she has a contraindication to [place]. 2.   Trial of modafinil for excessive daytime sleepiness and fatigue associated with MS. 3.   Stay active and exercise as tolerated. 4.   Take vitamin D supplements.  45-minute office visit with the majority of the time spent face-to-face for history and physical, discussion/counseling and decision-making.  Additional time with record review and documentation.  Elida Harbin A. Felecia Shelling, MD, [facility] 07/01/9483, 4:62 PM Certified in Neurology, Clinical Neurophysiology, Sleep Medicine and Neuroimaging  [facility] [address], [place], [place] [address] [phone]

## 2019-07-03 NOTE — Telephone Encounter (Signed)
JCV ab drawn. Put in the [facility] lock box for routine lab pick up.

## 2019-07-04 ENCOUNTER — Telehealth: Payer: Self-pay

## 2019-07-04 NOTE — Telephone Encounter (Signed)
Ocrevus start form faxed to [facility]. Received a receipt of confirmation.  Ocrevus order & start form given to [facility]. Dr. Felecia Shelling asked that I let them know that pt has off the week of 07/24/2019 and it would be beneficial to pt to start her infusion that week if possible.  I have informed Liane in [facility] of this.

## 2019-07-05 ENCOUNTER — Telehealth: Payer: Self-pay | Admitting: *Deleted

## 2019-07-05 ENCOUNTER — Ambulatory Visit: Payer: BLUE CROSS/BLUE SHIELD

## 2019-07-05 LAB — THYROID PANEL WITH TSH
Free Thyroxine Index: 2.2 (ref 1.2–4.9)
T3 Uptake Ratio: 30 % (ref 24–39)
T4, Total: 7.3 ug/dL (ref 4.5–12.0)
TSH: 1.94 u[IU]/mL (ref 0.450–4.500)

## 2019-07-05 LAB — HEPATITIS B SURFACE ANTIGEN: Hepatitis B Surface Ag: NEGATIVE

## 2019-07-05 LAB — HEPATITIS B SURFACE ANTIBODY,QUALITATIVE: Hep B Surface Ab, Qual: NONREACTIVE

## 2019-07-05 LAB — QUANTIFERON-TB GOLD PLUS
QuantiFERON Mitogen Value: >10 IU/mL
QuantiFERON Nil Value: 0 IU/mL
QuantiFERON TB1 Ag Value: 0.02 IU/mL
QuantiFERON TB2 Ag Value: 0 IU/mL
QuantiFERON-TB Gold Plus: NEGATIVE

## 2019-07-05 LAB — HIV ANTIBODY (ROUTINE TESTING W REFLEX): HIV Screen 4th Generation wRfx: NONREACTIVE

## 2019-07-05 LAB — HEPATITIS B CORE ANTIBODY, TOTAL: Hep B Core Total Ab: NEGATIVE

## 2019-07-05 NOTE — Telephone Encounter (Signed)
Submitted PA modafinil on CMM. Key: [place]. PA Case ID: [id number] - Rx #: [id number]. Waiting on determination from [facility].

## 2019-07-11 NOTE — Telephone Encounter (Signed)
PA for ocrevus faxed to [facility]. Received a receipt of confirmation. Should have a determination in 3-5 business days.

## 2019-07-12 ENCOUNTER — Ambulatory Visit: Payer: BLUE CROSS/BLUE SHIELD | Admitting: Internal Medicine

## 2019-07-12 ENCOUNTER — Ambulatory Visit: Payer: BLUE CROSS/BLUE SHIELD | Attending: Primary Care

## 2019-07-12 DIAGNOSIS — R2689 Other abnormalities of gait and mobility: Secondary | ICD-10-CM | POA: Insufficient documentation

## 2019-07-12 DIAGNOSIS — M6281 Muscle weakness (generalized): Secondary | ICD-10-CM | POA: Insufficient documentation

## 2019-07-13 NOTE — Telephone Encounter (Signed)
I spoke with Graylon Gunning, RN in [facility]. They will be calling pt to try and get her scheduled for her infusion next week.

## 2019-07-13 NOTE — Telephone Encounter (Signed)
I called [facility]. PA for ocrevus was approved from 07/06/2019-07/04/2020. PA # [id number]. I requested a fax copy of this approval and will provide this to [place], RN in [facility].

## 2019-07-14 ENCOUNTER — Ambulatory Visit (INDEPENDENT_AMBULATORY_CARE_PROVIDER_SITE_OTHER): Payer: BLUE CROSS/BLUE SHIELD | Admitting: Internal Medicine

## 2019-07-14 ENCOUNTER — Encounter: Payer: Self-pay | Admitting: Internal Medicine

## 2019-07-14 ENCOUNTER — Other Ambulatory Visit: Payer: Self-pay

## 2019-07-14 DIAGNOSIS — G35 Multiple sclerosis: Secondary | ICD-10-CM

## 2019-07-14 NOTE — Assessment & Plan Note (Signed)
Care is directed by her neurologist Dr Felecia Shelling She doesn't need MD for PCP

## 2019-07-14 NOTE — Patient Instructions (Addendum)
You do not need a primary care MD as opposed to seeing Allie Bossier NP.   Multiple Sclerosis Multiple sclerosis (MS) is a disease of the brain, spinal cord, and optic nerves (central nervous system). It causes the body's disease-fighting (immune) system to destroy the protective covering (myelin sheath) around nerves in the brain. When this happens, signals (nerve impulses) going to and from the brain and spinal cord do not get sent properly or may not get sent at all. There are several types of MS:  Relapsing-remitting MS. This is the most common type. This causes sudden attacks of symptoms. After an attack, you may recover completely until the next attack, or some symptoms may remain permanently.  Secondary progressive MS. This usually develops after the onset of relapsing-remitting MS. Similar to relapsing-remitting MS, this type also causes sudden attacks of symptoms. Attacks may be less frequent, but symptoms slowly get worse (progress) over time.  Primary progressive MS. This causes symptoms that steadily progress over time. This type of MS does not cause sudden attacks of symptoms. The age of onset of MS varies, but it often develops between 32-60 years of age. MS is a lifelong (chronic) condition. There is no cure, but treatment can help slow down the progression of the disease. What are the causes? The cause of this condition is not known. What increases the risk? You are more likely to develop this condition if:  You are a woman.  You have a relative with MS. However, the condition is not passed from parent to child (inherited).  You have a lack (deficiency) of vitamin D.  You smoke. MS is more common in the [place] than in the [place]. What are the signs or symptoms? Relapsing-remitting and secondary progressive MS cause symptoms to occur in episodes or attacks that may last weeks to months. There may be long periods between attacks in which there are  almost no symptoms. Primary progressive MS causes symptoms to steadily progress after they develop. Symptoms of MS vary because of the many different ways it affects the central nervous system. The main symptoms include:  Vision problems and eye pain.  Numbness.  Weakness.  Inability to move your arms, hands, feet, or legs (paralysis).  Balance problems.  Shaking that you cannot control (tremors).  Muscle spasms.  Problems with thinking (cognitive changes). MS can also cause symptoms that are associated with the disease, but are not always the direct result of an MS attack. They may include:  Inability to control urination or bowel movements (incontinence).  Headaches.  Fatigue.  Inability to tolerate heat.  Emotional changes.  Depression.  Pain. How is this diagnosed? This condition is diagnosed based on:  Your symptoms.  A neurological exam. This involves checking central nervous system function, such as nerve function, reflexes, and coordination.  MRIs of the brain and spinal cord.  Lab tests, including a lumbar puncture that tests the fluid that surrounds the brain and spinal cord (cerebrospinal fluid).  Tests to measure the electrical activity of the brain in response to stimulation (evoked potentials). How is this treated? There is no cure for MS, but medicines can help decrease the number and frequency of attacks and help relieve nuisance symptoms. Treatment options may include:  Medicines that reduce the frequency of attacks. These medicines may be given by injection, by mouth (orally), or through an IV.  Medicines that reduce inflammation (steroids). These may provide short-term relief of symptoms.  Medicines to help control pain,  depression, fatigue, or incontinence.  Vitamin D, if you have a deficiency.  Using devices to help you move around (assistive devices), such as braces, a cane, or a walker.  Physical therapy to strengthen and stretch your  muscles.  Occupational therapy to help you with everyday tasks.  Alternative or complementary treatments such as exercise, massage, or acupuncture. Follow these instructions at home:  Take over-the-counter and prescription medicines only as told by your health care provider.  Do not drive or use heavy machinery while taking prescription pain medicine.  Use assistive devices as recommended by your physical therapist or your health care provider.  Exercise as directed by your health care provider.  Return to your normal activities as told by your health care provider. Ask your health care provider what activities are safe for you.  Reach out for support. Share your feelings with friends, family, or a support group.  Keep all follow-up visits as told by your health care provider and therapists. This is important. Where to find more information  National Multiple Sclerosis Society: https://www.nationalmssociety.org Contact a health care provider if:  You feel depressed.  You develop new pain or numbness.  You have tremors.  You have problems with sexual function. Get help right away if:  You develop paralysis.  You develop numbness.  You have problems with your bladder or bowel function.  You develop double vision.  You lose vision in one or both eyes.  You develop suicidal thoughts.  You develop severe confusion. If you ever feel like you may hurt yourself or others, or have thoughts about taking your own life, get help right away. You can go to your nearest emergency department or call:  Your local emergency services (911 in the [place]  A suicide crisis helpline, such as the [facility] at 785-056-5483. This is open 24 hours a day. Summary  Multiple sclerosis (MS) is a disease of the central nervous system that causes the body's immune system to destroy the protective covering (myelin sheath) around nerves in the brain.  There are 3  types of MS: relapsing-remitting, secondary progressive, and primary progressive. Relapsing-remitting and secondary progressive MS cause symptoms to occur in episodes or attacks that may last weeks to months. Primary progressive MS causes symptoms to steadily progress after they develop.  There is no cure for MS, but medicines can help decrease the number and frequency of attacks and help relieve nuisance symptoms. Treatment may also include physical or occupational therapy.  If you develop numbness, paralysis, vision problems, or other neurological symptoms, get help right away. This information is not intended to replace advice given to you by your health care provider. Make sure you discuss any questions you have with your health care provider. Document Revised: 12/04/2016 Document Reviewed: 03/02/2016 Elsevier Patient Education  2020 [employer].

## 2019-07-14 NOTE — Progress Notes (Signed)
Subjective:    Patient ID: Angel Reilly, female    DOB: 1975-09-03, 44 y.o.   MRN: [id number]  HPI Here for consultation Was diagnosed with MS recently and parents feel that she needs an MD as PCP instead of NP  This visit occurred during the SARS-CoV-2 public health emergency.  Safety protocols were in place, including screening questions prior to the visit, additional usage of staff PPE, and extensive cleaning of exam room while observing appropriate contact time as indicated for disinfecting solutions.   She has good relationship with Allie Bossier NP  Current Outpatient Medications on File Prior to Visit  Medication Sig Dispense Refill   modafinil (PROVIGIL) 200 MG tablet Take 1 tablet (200 mg total) by mouth daily. 30 tablet 5   Multiple Vitamins-Minerals (MULTIVITAMIN WITH MINERALS) tablet Take 1 tablet by mouth daily.     Vitamin D, Ergocalciferol, (DRISDOL) 1.25 MG (50000 UNIT) CAPS capsule Take 1 capsule (50,000 Units total) by mouth every 7 (seven) days. 13 capsule 1   No current facility-administered medications on file prior to visit.    No Known Allergies  Past Medical History:  Diagnosis Date   Abnormal Pap smear of cervix    History of cervical dysplasia 01/15/2017   Pap smear abnormality of cervix/human papillomavirus (HPV) positive    Post partum depression     Past Surgical History:  Procedure Laterality Date   BREAST BIOPSY Left 2000   benign results   BREAST BIOPSY Left 2018   us/ bx/clip-neg fibradenoma   DILATION AND CURETTAGE OF UTERUS     IR FL GUIDED LOC OF NEEDLE/CATH TIP FOR SPINAL INJECTION RT  06/20/2019   WISDOM TOOTH EXTRACTION      Family History  Problem Relation Age of Onset   Breast cancer Maternal Aunt        mid 60's   Colon cancer Maternal Aunt    Depression Mother    Hypertension Mother    Heart disease Father    Heart attack Father    Hypertension Father    Depression Brother    Heart disease Maternal  Grandmother    Hypertension Maternal Grandmother     Social History   Socioeconomic History   Marital status: Single    Spouse name: Not on file   Number of children: 1   Years of education: Not on file   Highest education level: Not on file  Occupational History   Not on file  Tobacco Use   Smoking status: Never Smoker   Smokeless tobacco: Never Used  Vaping Use   Vaping Use: Never used  Substance and Sexual Activity   Alcohol use: Yes    Comment: wine 1-2 per month   Drug use: No   Sexual activity: Yes    Birth control/protection: None  Other Topics Concern   Not on file  Social History Narrative   Caffeine use: Soda daily   Single.   1 child.   Works as an Administrator.    Investment banker, operational of Radio broadcast assistant Strain:    Difficulty of Paying Living Expenses:   Food Insecurity:    Worried About Charity fundraiser in the Last Year:    Arboriculturist in the Last Year:   Transportation Needs:    Film/video editor (Medical):    Lack of Transportation (Non-Medical):   Physical Activity:    Days of Exercise per Week:    Minutes of Exercise per Session:  Stress:    Feeling of Stress :   Social Connections:    Frequency of Communication with Friends and Family:    Frequency of Social Gatherings with Friends and Family:    Attends Religious Services:    Active Member of Clubs or Organizations:    Attends Music therapist:    Marital Status:   Intimate Partner Violence:    Fear of Current or Ex-Partner:    Emotionally Abused:    Physically Abused:    Sexually Abused:    Review of Systems     Objective:   Physical Exam Constitutional:      Appearance: Normal appearance.  Neurological:     Mental Status: She is alert.  Psychiatric:        Mood and Affect: Mood normal.        Behavior: Behavior normal.            Assessment & Plan:

## 2019-07-19 ENCOUNTER — Other Ambulatory Visit: Payer: Self-pay

## 2019-07-19 ENCOUNTER — Ambulatory Visit: Payer: BLUE CROSS/BLUE SHIELD

## 2019-07-19 DIAGNOSIS — M6281 Muscle weakness (generalized): Secondary | ICD-10-CM

## 2019-07-19 DIAGNOSIS — R2689 Other abnormalities of gait and mobility: Secondary | ICD-10-CM

## 2019-07-20 NOTE — Therapy (Signed)
[facility] [address] [place], [place], [address] Phone: [phone]   Fax:  [phone]  Physical Therapy Evaluation  Patient Details  Name: Angel Reilly MRN: [id number] Date of Birth: 08-21-1975 Referring Provider (PT): Alma Friendly, NP   Encounter Date: 07/19/2019   PT End of Session - 07/19/19 1624    Visit Number 1    Number of Visits 12    PT Start Time 9024    PT Stop Time 0973    PT Time Calculation (min) 38 min    Activity Tolerance Patient tolerated treatment well    Behavior During Therapy [facility] for tasks assessed/performed           Past Medical History:  Diagnosis Date  . Abnormal Pap smear of cervix   . History of cervical dysplasia 01/15/2017  . Pap smear abnormality of cervix/human papillomavirus (HPV) positive   . Post partum depression     Past Surgical History:  Procedure Laterality Date  . BREAST BIOPSY Left 2000   benign results  . BREAST BIOPSY Left 2018   us/ bx/clip-neg fibradenoma  . DILATION AND CURETTAGE OF UTERUS    . IR FL GUIDED LOC OF NEEDLE/CATH TIP FOR SPINAL INJECTION RT  06/20/2019  . WISDOM TOOTH EXTRACTION      There were no vitals filed for this visit.    Subjective Assessment - 07/19/19 1626    Subjective Pt was recently diagnosed with relapsing-remitting MS in May/June of 2021. Pt reports last exacerbation in May. She reports she was having vomiting and went to ED and was also having balance issues and diplopia during that time. Pt reports that currently she is having diplopia when exhausted and is walking more stiff. Having more difficulty with going up stairs due to stiffness. She has to pull herself up stairs. Seems to be retaining more fluid in legs as well. Does have intermittent pain in legs from this. Reports that face is numb and does not have full taste. Right arm sensation seems a little affected as well.    Patient Stated Goals Pt would like to be  able to lose weight and be healthier so she can move better.    Currently in Pain? No/denies              [facility] PT Assessment - 07/19/19 1631      Assessment   Medical Diagnosis MS    Referring Provider (PT) Alma Friendly, NP    Onset Date/Surgical Date 06/15/19    Hand Dominance Left    Prior Therapy no      Precautions   Precautions None      Balance Screen   Has the patient fallen in the past 6 months Yes    How many times? 1   slid down stairs when symptoms first started.   Has the patient had a decrease in activity level because of a fear of falling?  No    Is the patient reluctant to leave their home because of a fear of falling?  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    Leisure care for her daughter, exercising  with walking, working legs recently      Sensation   Additional Comments right side of face is numb, right arm sensation still feels less but not as numb      Coordination   Gross Motor Movements are Fluid and Coordinated Yes   RAMs intact   Fine Motor Movements are Fluid and Coordinated Yes   finger opposition intact     ROM / Strength   AROM / PROM / Strength Strength      Strength   Strength Assessment Site Shoulder;Elbow;Hand;Hip;Knee;Ankle    Right/Left Shoulder Right;Left    Right Shoulder Flexion 5/5    Left Shoulder Flexion  5/5    Right/Left Elbow Right;Left    Right Elbow Flexion 5/5    Right Elbow Extension 5/5    Left Elbow Flexion 5/5    Left Elbow Extension 5/5    Right/Left hand Left;Right    Right Hand Gross Grasp Functional   history of bilateral carpal tunnel   Left Hand Gross Grasp Functional    Right/Left Hip Right;Left    Right Hip Flexion 3+/5    Right Hip Extension 2-/5    Right Hip ABduction 2+/5    Left Hip Flexion 4/5    Left Hip Extension 3/5    Left Hip ABduction 3/5    Right/Left Knee Right;Left    Right Knee Flexion 3/5    Right Knee Extension 4+/5    Left Knee Flexion 3+/5    Left Knee Extension 5/5    Right/Left Ankle Right;Left    Right Ankle Dorsiflexion 4+/5    Left Ankle Dorsiflexion 4+/5      Bed Mobility   Bed Mobility Rolling Right;Rolling Left;Supine to Sit;Sit to Supine    Rolling Right Independent    Rolling Left Independent    Supine to Sit Independent    Sit to Supine Independent      Transfers   Transfers Sit to Stand;Stand to Sit    Sit to Stand 7: Independent    Stand to Sit 7: Independent    Comments 30 sec sit to stand=14 from low mat without UE support      Ambulation/Gait   Ambulation/Gait Yes    Ambulation/Gait Assistance 5: Supervision    Ambulation/Gait Assistance Details Pt has increased lateral sway with gait with wide BOS    Ambulation Distance (Feet) 100 Feet    Assistive device None    Gait Pattern Step-through pattern;Lateral hip instability;Wide base of support    Ambulation Surface Level;Indoor    Gait velocity 9.9 sec=1.72m/s    Stairs Yes    Stairs Assistance 6: Modified independent (Device/Increase time)    Stair Management Technique Two rails;Alternating pattern    Number of Stairs 4      Functional Gait  Assessment   Gait assessed  Yes    Gait Level Surface Walks 20 ft in less than 7 sec but greater than 5.5 sec, uses assistive device, slower speed, mild gait deviations, or deviates 6-10 in outside of the 12 in walkway  width.     Change in Gait Speed Able to smoothly change walking speed without loss of balance or gait deviation. Deviate no more than 6 in outside of the 12 in walkway width.    Gait with Horizontal Head Turns Performs head turns smoothly with no change in gait. Deviates no more than 6 in outside 12 in walkway width    Gait with Vertical Head Turns Performs head turns with no change in gait. Deviates no more than 6 in outside 12 in walkway width.    Gait and Pivot Turn Pivot turns safely within 3 sec and stops quickly with no loss of balance.    Step Over Obstacle Is able to step over 2 stacked shoe boxes taped together (9 in total height) without changing gait speed. No evidence of imbalance.    Gait with Narrow Base of Support Ambulates 4-7 steps.    Gait with Eyes Closed Walks 20 ft, slow speed, abnormal gait pattern, evidence for imbalance, deviates 10-15 in outside 12 in walkway width. Requires more than 9 sec to ambulate 20 ft.    Ambulating Backwards Walks 20 ft, no assistive devices, good speed, no evidence for imbalance, normal gait    Steps Alternating feet, must use rail.    Total Score 24                      Objective measurements completed on examination: See above findings.               PT Education - 07/19/19 2108    Education Details PT plan of care. Instructed to continue to work on squats and demonstrated clamshell exercises x 10.    Person(s) Educated Patient    Methods Explanation;Demonstration    Comprehension Verbalized understanding            PT Short Term Goals - 07/20/19 0658      PT SHORT TERM GOAL #1   Title Pt will be independent with initial HEP for strengthening and balance.    Time 4    Period Weeks    Status New    Target Date 08/19/19      PT SHORT TERM GOAL #2   Title Pt will be able to increase 30 sec sit to stand from 14 reps to >16 reps for improved functional strength.    Baseline 14 reps from low mat without hands at eval      Time 4    Period Weeks    Status New    Target Date 08/19/19      PT SHORT TERM GOAL #3   Title Pt will ambulate > 800' on varied surfaces independently for improved community mobiliy.    Time 4    Period Weeks    Status New    Target Date 08/19/19             PT Long Term Goals - 07/20/19 0818      PT LONG TERM GOAL #1   Title Pt will be independent with progressive HEP to continue strength, balance and improving activity tolerance on own.    Time 8    Period Weeks    Status New    Target Date 09/18/19      PT LONG TERM GOAL #2   Title Pt will increase FGA from 24/30 to 26/30 or more for improved balance and gait safety.    Time 8    Period Weeks  Status New    Target Date 09/18/19      PT LONG TERM GOAL #3   Title Pt will be able to ambulate up/down stairs in reciprocal pattern without rails for improved functional strength and mobility.    Time 8    Period Weeks    Status New    Target Date 09/18/19                  Plan - 07/20/19 0647    Clinical Impression Statement Pt is 44 y/o female with recent diagnosis of relapsing remitting MS. Pt presents with weakness in RLE > LE mostly around hips and at hamstrings. Pt is ambulating with increased lateral sway due to this. 30 sec sit to stand of 14 reps shows functional strength deficit as is decreased from norms. Gait speed of 1.29m/s is safe community ambulator speed but decreased from norms. She is medium fall risk based on FGA score of 24/30. Pt also reports more fatigue along with diplopia when she gets exhausted. Pt will benefit from skilled PT to address strength, balance and functional mobility deficits.    Examination-Activity Limitations Locomotion Level;Stairs    Examination-Participation Restrictions Community Activity;Yard Work    Biomedical scientist Low    Rehab Potential Good    PT Frequency 2x / week   followed by 1x/week  for 5 weeks (plus eval)   PT Duration 3 weeks    PT Treatment/Interventions ADLs/Self Care Home Management;Aquatic Therapy;DME Instruction;Therapeutic activities;Functional mobility training;Stair training;Gait training;Therapeutic exercise;Balance training;Neuromuscular re-education;Patient/family education;Passive range of motion;Manual techniques;Vestibular    PT Next Visit Plan Issue more information on MS as pt reported having very little info other than what she looked up herself. Further assess aerobic tolerance on NuStep or treadmill. Pt asking about getting note from PT about why bike would benefit her to have at home as MS society would help with this. Start initial HEP for strengthening targeting hips (try clamshell, bridges) and hamstrings with right most affected but both weaker. Pt may want to transfer care to [facility] clinic when she returns to going in to work?    PT Home Exercise Plan Verbally discussed squats and clamshell to start at home.    Consulted and Agree with Plan of Care Patient           Patient will benefit from skilled therapeutic intervention in order to improve the following deficits and impairments:  Abnormal gait, Decreased activity tolerance, Decreased endurance, Decreased mobility, Decreased range of motion, Decreased strength, Decreased balance, Impaired sensation, Impaired vision/preception  Visit Diagnosis: Other abnormalities of gait and mobility  Muscle weakness (generalized)     Problem List Patient Active Problem List   Diagnosis Date Noted  . Multiple sclerosis ([place]) 07/03/2019  . High risk medication use 07/03/2019  . Vitamin D deficiency 07/03/2019  . Abnormal brain MRI 05/24/2019  . Diplopia 05/24/2019  . Balance disorder 05/24/2019  . Low vitamin D level 05/24/2019  . Hypersomnia 05/24/2019  . Vaginal discharge 12/07/2018  . Skin mass 12/07/2018  . Anxiety and depression 06/07/2017  . Obesity (BMI 35.0-39.9 without comorbidity)  06/07/2017  . Atypical squamous cells of undetermined significance (ASC-US) on cervical Pap smear 03/09/2014  . Carpal tunnel syndrome, unspecified upper limb 04/28/2010    Electa Sniff, PT, DPT, NCS 07/20/2019, 8:22 AM  [facility] [address] [place], [place], [address] Phone: [phone]   Fax:  [phone]  Name: ACELYN BASHAM MRN: [id number] Date of Birth: February 08, 1975

## 2019-07-26 NOTE — Telephone Encounter (Signed)
Pt is scheduled for her first 300mg  ocrevus infusion 07/26/19.

## 2019-07-28 ENCOUNTER — Ambulatory Visit: Payer: BLUE CROSS/BLUE SHIELD

## 2019-07-31 DIAGNOSIS — E559 Vitamin D deficiency, unspecified: Secondary | ICD-10-CM

## 2019-07-31 DIAGNOSIS — Z1152 Encounter for screening for COVID-19: Secondary | ICD-10-CM

## 2019-08-01 ENCOUNTER — Other Ambulatory Visit: Payer: Self-pay | Admitting: Neurology

## 2019-08-01 NOTE — Addendum Note (Signed)
Encounter addended by: Riley Churches on: 08/01/2019 9:53 AM  Actions taken: Imaging Exam ended, Charge Capture section accepted

## 2019-08-01 NOTE — Telephone Encounter (Signed)
JCV index value: 0.32 H, indeterminate. Inhibition assay: negative.

## 2019-08-01 NOTE — Telephone Encounter (Signed)
I called [facility]. We have not received these results. They will fax me these results today.

## 2019-08-02 ENCOUNTER — Other Ambulatory Visit: Payer: Self-pay | Admitting: Neurology

## 2019-08-02 ENCOUNTER — Ambulatory Visit: Payer: BLUE CROSS/BLUE SHIELD

## 2019-08-04 ENCOUNTER — Ambulatory Visit: Payer: BLUE CROSS/BLUE SHIELD

## 2019-08-08 ENCOUNTER — Ambulatory Visit: Payer: BLUE CROSS/BLUE SHIELD

## 2019-08-11 ENCOUNTER — Ambulatory Visit: Payer: BLUE CROSS/BLUE SHIELD

## 2019-08-16 ENCOUNTER — Encounter (HOSPITAL_COMMUNITY): Payer: Self-pay

## 2019-08-16 ENCOUNTER — Other Ambulatory Visit: Payer: Self-pay | Admitting: Neurology

## 2019-08-16 DIAGNOSIS — H532 Diplopia: Secondary | ICD-10-CM

## 2019-08-16 DIAGNOSIS — R9389 Abnormal findings on diagnostic imaging of other specified body structures: Secondary | ICD-10-CM

## 2019-08-16 DIAGNOSIS — R2689 Other abnormalities of gait and mobility: Secondary | ICD-10-CM

## 2019-08-16 NOTE — Addendum Note (Signed)
Encounter addended by: Riley Churches on: 08/16/2019 7:59 AM  Actions taken: Imaging Exam ended

## 2019-08-22 ENCOUNTER — Ambulatory Visit (INDEPENDENT_AMBULATORY_CARE_PROVIDER_SITE_OTHER): Payer: BLUE CROSS/BLUE SHIELD | Admitting: Psychology

## 2019-08-22 DIAGNOSIS — F411 Generalized anxiety disorder: Secondary | ICD-10-CM

## 2019-09-05 ENCOUNTER — Encounter: Payer: Self-pay | Admitting: Neurology

## 2019-09-08 ENCOUNTER — Ambulatory Visit (INDEPENDENT_AMBULATORY_CARE_PROVIDER_SITE_OTHER): Payer: BLUE CROSS/BLUE SHIELD | Admitting: Psychology

## 2019-09-08 DIAGNOSIS — F411 Generalized anxiety disorder: Secondary | ICD-10-CM

## 2019-09-15 ENCOUNTER — Ambulatory Visit: Payer: BLUE CROSS/BLUE SHIELD | Admitting: Psychology

## 2019-09-20 ENCOUNTER — Encounter: Payer: Self-pay | Admitting: Neurology

## 2019-10-10 ENCOUNTER — Other Ambulatory Visit: Payer: Self-pay | Admitting: Neurology

## 2019-10-10 DIAGNOSIS — G35 Multiple sclerosis: Secondary | ICD-10-CM

## 2019-10-11 ENCOUNTER — Telehealth: Payer: Self-pay | Admitting: Neurology

## 2019-10-11 NOTE — Telephone Encounter (Signed)
no to the covid questions MR Brain w/wo contrast , MR Cervical spine w/wo contrast & MR Thoracic spine w/wo contrast Dr. Cheree Ditto Auth: [place] spoke to [place] Ref # [id number]. Patient is scheduled at [facility] for 11/21/19.

## 2019-11-06 ENCOUNTER — Other Ambulatory Visit: Payer: Self-pay

## 2019-11-06 DIAGNOSIS — Z1231 Encounter for screening mammogram for malignant neoplasm of breast: Secondary | ICD-10-CM

## 2019-11-07 ENCOUNTER — Other Ambulatory Visit: Payer: BLUE CROSS/BLUE SHIELD

## 2019-11-08 ENCOUNTER — Other Ambulatory Visit: Payer: Self-pay

## 2019-11-08 ENCOUNTER — Ambulatory Visit (INDEPENDENT_AMBULATORY_CARE_PROVIDER_SITE_OTHER): Payer: BLUE CROSS/BLUE SHIELD

## 2019-11-08 DIAGNOSIS — Z23 Encounter for immunization: Secondary | ICD-10-CM | POA: Diagnosis not present

## 2019-11-20 ENCOUNTER — Other Ambulatory Visit: Payer: Self-pay | Admitting: *Deleted

## 2019-11-20 MED ORDER — ALPRAZOLAM 0.5 MG PO TABS
ORAL_TABLET | ORAL | 0 refills | Status: DC
Start: 2019-11-20 — End: 2020-02-23

## 2019-11-21 ENCOUNTER — Ambulatory Visit: Payer: BLUE CROSS/BLUE SHIELD

## 2019-11-21 DIAGNOSIS — G35 Multiple sclerosis: Secondary | ICD-10-CM

## 2019-11-21 MED ORDER — GADOBENATE DIMEGLUMINE 529 MG/ML IV SOLN
20.0000 mL | Freq: Once | INTRAVENOUS | Status: AC | PRN
Start: 2019-11-21 — End: 2019-11-21
  Administered 2019-11-21: 20 mL via INTRAVENOUS

## 2019-11-22 ENCOUNTER — Other Ambulatory Visit: Payer: BLUE CROSS/BLUE SHIELD

## 2019-11-22 ENCOUNTER — Ambulatory Visit: Payer: BLUE CROSS/BLUE SHIELD

## 2019-11-23 ENCOUNTER — Telehealth: Payer: Self-pay | Admitting: Neurology

## 2019-11-23 NOTE — Telephone Encounter (Signed)
Mychart message that pt sent last night routed to MD first thing this morning. Waiting on MD response. Results just came back yesterday. Provider still reviewing.

## 2019-11-23 NOTE — Telephone Encounter (Signed)
Pt. is asking for a call from RN for MRI results.

## 2019-11-23 NOTE — Telephone Encounter (Signed)
I spoke to Angel Reilly about the MRI results.  The MRI of the brain did not show anything new.  She has the 1 focus in the posterior pons that had caused her symptoms earlier.  It is a little smaller on the current MRI of her brain though did actually have a small amount of enhancement.  The other foci are small and more typical in the periventricular white matter  The spinal cord did not show any MS lesions.  She does have a few disc bulges but there is no nerve root compression or spinal stenosis.

## 2019-11-27 NOTE — Telephone Encounter (Signed)
Gave completed/signed form back to medical records to process for pt. 

## 2019-12-26 ENCOUNTER — Other Ambulatory Visit: Payer: Self-pay | Admitting: Primary Care

## 2019-12-26 DIAGNOSIS — Z1231 Encounter for screening mammogram for malignant neoplasm of breast: Secondary | ICD-10-CM

## 2020-01-04 ENCOUNTER — Other Ambulatory Visit: Payer: Self-pay | Admitting: Neurology

## 2020-01-10 ENCOUNTER — Other Ambulatory Visit: Payer: Self-pay | Admitting: Obstetrics & Gynecology

## 2020-01-10 ENCOUNTER — Telehealth: Payer: Self-pay | Admitting: Obstetrics & Gynecology

## 2020-01-10 DIAGNOSIS — Z1231 Encounter for screening mammogram for malignant neoplasm of breast: Secondary | ICD-10-CM

## 2020-01-10 NOTE — Telephone Encounter (Signed)
-----   Message from Nadara Mustard, MD sent at 01/10/2020  7:58 AM EST ----- Regarding: Sch Annual

## 2020-01-10 NOTE — Telephone Encounter (Signed)
Called and left voicemail for patient to call back to be scheduled. 

## 2020-02-23 ENCOUNTER — Other Ambulatory Visit: Payer: Self-pay

## 2020-02-23 ENCOUNTER — Encounter: Payer: Self-pay | Admitting: Primary Care

## 2020-02-23 ENCOUNTER — Ambulatory Visit: Payer: BLUE CROSS/BLUE SHIELD | Admitting: Primary Care

## 2020-02-23 VITALS — BP 120/64 | HR 96 | Temp 97.6°F | Ht 67.0 in | Wt 281.0 lb

## 2020-02-23 DIAGNOSIS — R7989 Other specified abnormal findings of blood chemistry: Secondary | ICD-10-CM

## 2020-02-23 DIAGNOSIS — E559 Vitamin D deficiency, unspecified: Secondary | ICD-10-CM

## 2020-02-23 DIAGNOSIS — R600 Localized edema: Secondary | ICD-10-CM

## 2020-02-23 DIAGNOSIS — Z6841 Body Mass Index (BMI) 40.0 and over, adult: Secondary | ICD-10-CM

## 2020-02-23 DIAGNOSIS — G35 Multiple sclerosis: Secondary | ICD-10-CM

## 2020-02-23 DIAGNOSIS — R609 Edema, unspecified: Secondary | ICD-10-CM | POA: Insufficient documentation

## 2020-02-23 HISTORY — DX: Localized edema: R60.0

## 2020-02-23 LAB — CBC
HCT: 38.1 % (ref 36.0–46.0)
Hemoglobin: 13 g/dL (ref 12.0–15.0)
MCHC: 34.2 g/dL (ref 30.0–36.0)
MCV: 90.7 fl (ref 78.0–100.0)
Platelets: 257 10*3/uL (ref 150.0–400.0)
RBC: 4.2 Mil/uL (ref 3.87–5.11)
RDW: 12.5 % (ref 11.5–15.5)
WBC: 4.1 10*3/uL (ref 4.0–10.5)

## 2020-02-23 LAB — COMPREHENSIVE METABOLIC PANEL
ALT: 19 U/L (ref 0–35)
AST: 14 U/L (ref 0–37)
Albumin: 4 g/dL (ref 3.5–5.2)
Alkaline Phosphatase: 67 U/L (ref 39–117)
BUN: 15 mg/dL (ref 6–23)
CO2: 28 mEq/L (ref 19–32)
Calcium: 9.4 mg/dL (ref 8.4–10.5)
Chloride: 105 mEq/L (ref 96–112)
Creatinine, Ser: 0.84 mg/dL (ref 0.40–1.20)
GFR: 85.88 mL/min (ref >60.00)
Glucose, Bld: 107 mg/dL — ABNORMAL HIGH (ref 70–99)
Potassium: 4.2 mEq/L (ref 3.5–5.1)
Sodium: 137 mEq/L (ref 135–145)
Total Bilirubin: 0.6 mg/dL (ref 0.2–1.2)
Total Protein: 7 g/dL (ref 6.0–8.3)

## 2020-02-23 LAB — HEMOGLOBIN A1C: Hgb A1c MFr Bld: 5.8 % (ref 4.6–6.5)

## 2020-02-23 LAB — TSH: TSH: 1.79 u[IU]/mL (ref 0.35–4.50)

## 2020-02-23 LAB — VITAMIN D 25 HYDROXY (VIT D DEFICIENCY, FRACTURES): VITD: 24.34 ng/mL — ABNORMAL LOW (ref 30.00–100.00)

## 2020-02-23 NOTE — Assessment & Plan Note (Signed)
Noted to ankles bilaterally today. No pitting. Likely secondary to a combination of weight gain and steroids.   Checking labs today. Consider low dose furosemide PRN with MS treatments. Await results.

## 2020-02-23 NOTE — Patient Instructions (Addendum)
Stop by the lab prior to leaving today. I will notify you of your results once received.   Ensure you are consuming 64 ounces of water daily.  Limit salty, processed, frozen, canned foods.  Eat more fresh fruits, vegetables, lean protein.   [facility] in [place].  I'll be in touch soon.  It was a pleasure to see you today!

## 2020-02-23 NOTE — Assessment & Plan Note (Signed)
Could partially be due to steroid use, but also suspect lifestyle is contributing.  Recommended she check out [facility] in [place].   Recommended regular exercise, limit processed/salty food, increase veggies/fruit/lean protein

## 2020-02-23 NOTE — Progress Notes (Signed)
Subjective:    Patient ID: Angel Reilly, female    DOB: [birth year], 45 y.o.   MRN: [id number]  HPI  This visit occurred during the SARS-CoV-2 public health emergency.  Safety protocols were in place, including screening questions prior to the visit, additional usage of staff PPE, and extensive cleaning of exam room while observing appropriate contact time as indicated for disinfecting solutions.   Mr. Nobles is a 45 year old female with a history of multiple sclersis, carpal tunnel syndrome, anxiety and depression who presents today to discuss lower extremity swelling.  Currently undergoing treatment for MS with Ocrevus injections, and moving forward she will receive injections twice annually. She's received three injections total, also receives steroids along with the treatment. Following with [facility], Dr. Manuella Ghazi.   Overall balance and vision has improved, especially after steroids. She's mostly struggling with lower extremity swelling, abdominal bloating, weight gain. Swelling is worse during and a few days after steroids with her MS infusions. Her last dose of steroids was three days ago. In between injections and steroids she does have lower extremity swelling which is not as extreme.   She's putting on weight despite a "normal diet". She denies eating sweets, fast food, was eating a box of candy over two weeks. She is calorie counting and tries to stay around 1300-1400, eats tuna, eggs, smoothies, peanut butter. She is not exercising due to her lower extremity swelling, but did purchase a [place]. She was told by her neurologist to see her "PCP" for her swelling as they don't manage that.   She's been taking Diurex OTC and feels that his helps some with bloating and swelling, but she has to take four pills in order to see improvement.   Wt Readings from Last 3 Encounters:  02/23/20 281 lb (127.5 kg)  07/14/19 244 lb (110.7 kg)  07/03/19 244 lb (110.7 kg)   BP Readings from  Last 3 Encounters:  02/23/20 120/64  07/14/19 120/68  07/03/19 120/90     Review of Systems  Eyes: Negative for visual disturbance.  Respiratory: Negative for shortness of breath.   Cardiovascular: Positive for leg swelling. Negative for palpitations.  Gastrointestinal:       Abdominal bloating  Neurological: Negative for dizziness.       Past Medical History:  Diagnosis Date  . Abnormal Pap smear of cervix   . History of cervical dysplasia 01/15/2017  . Pap smear abnormality of cervix/human papillomavirus (HPV) positive   . Post partum depression      Social History   Socioeconomic History  . Marital status: Single    Spouse name: Not on file  . Number of children: 1  . Years of education: Not on file  . Highest education level: Not on file  Occupational History  . Not on file  Tobacco Use  . Smoking status: Never Smoker  . Smokeless tobacco: Never Used  Vaping Use  . Vaping Use: Never used  Substance and Sexual Activity  . Alcohol use: Yes    Comment: wine 1-2 per month  . Drug use: No  . Sexual activity: Yes    Birth control/protection: None  Other Topics Concern  . Not on file  Social History Narrative   Caffeine use: Soda daily   Single.   1 child.   Works as an Administrator.    Scientist, physiological Strain: Not on [employer] Insecurity: Not on file  Transportation Needs: Not on  file  Physical Activity: Not on file  Stress: Not on file  Social Connections: Not on file  Intimate Partner Violence: Not on file    Past Surgical History:  Procedure Laterality Date  . BREAST BIOPSY Left 2000   benign results  . BREAST BIOPSY Left 2018   us/ bx/clip-neg fibradenoma  . DILATION AND CURETTAGE OF UTERUS    . WISDOM TOOTH EXTRACTION      Family History  Problem Relation Age of Onset  . Breast cancer Maternal Aunt        mid 77's  . Colon cancer Maternal Aunt   . Depression Mother   . Hypertension Mother   . Heart  disease Father   . Heart attack Father   . Hypertension Father   . Depression Brother   . Heart disease Maternal Grandmother   . Hypertension Maternal Grandmother     No Known Allergies  Current Outpatient Medications on File Prior to Visit  Medication Sig Dispense Refill  . modafinil (PROVIGIL) 200 MG tablet Take 1 tablet (200 mg total) by mouth daily. 30 tablet 5  . Multiple Vitamins-Minerals (MULTIVITAMIN WITH MINERALS) tablet Take 1 tablet by mouth daily.    Marland Kitchen ocrelizumab (OCREVUS) 300 MG/10ML injection Inject 600 mg into the vein every 6 (six) months. [facility] Intraufsion.    . Vitamin D, Ergocalciferol, (DRISDOL) 1.25 MG (50000 UNIT) CAPS capsule Take 1 capsule (50,000 Units total) by mouth every 7 (seven) days. 13 capsule 1   No current facility-administered medications on file prior to visit.    BP 120/64   Pulse 96   Temp 97.6 F (36.4 C) (Temporal)   Ht 5\' 7"  (1.702 m)   Wt 281 lb (127.5 kg)   SpO2 98%   BMI 44.01 kg/m    Objective:   Physical Exam Constitutional:      Appearance: She is well-nourished.  Cardiovascular:     Rate and Rhythm: Normal rate and regular rhythm.     Comments: Ankle edema noted bilaterally. No pitting to lower extremities.    Pulmonary:     Effort: Pulmonary effort is normal.     Breath sounds: Normal breath sounds.  Abdominal:     General: There is no distension.     Palpations: Abdomen is soft.  Musculoskeletal:     Cervical back: Neck supple.  Skin:    General: Skin is warm and dry.  Psychiatric:        Mood and Affect: Mood and affect normal.            Assessment & Plan:

## 2020-02-23 NOTE — Assessment & Plan Note (Signed)
Compliant to vitamin D 50,000 units weekly, repeat vitamin D level pending.

## 2020-02-23 NOTE — Assessment & Plan Note (Signed)
Formally diagnosed, now following with [facility], Dr. Manuella Ghazi.   Suspect lower extremity swelling is a combination of weight gain and also steroids side effects. Unfortunately her neurologist is unwilling to aid in reduction of her swelling.  Will need to check labs today.  Consider trial of furosemide 20 mg PRN along with infusion treatments.

## 2020-02-25 NOTE — Telephone Encounter (Signed)
Can we set up a virtual visit with her for this coming week? Tell her that I am more than willing to help her, just have some follow up questions so we ensure to put her on the right treatment.

## 2020-02-26 NOTE — Telephone Encounter (Signed)
Left message to return call to our office.  

## 2020-02-28 NOTE — Telephone Encounter (Signed)
Called patient and lvm to call back and schedule visit.

## 2020-03-04 NOTE — Telephone Encounter (Signed)
Called patient again to schedule visit. LVM to call back.

## 2020-03-19 ENCOUNTER — Other Ambulatory Visit: Payer: Self-pay | Admitting: Neurology

## 2020-03-19 DIAGNOSIS — G35 Multiple sclerosis: Secondary | ICD-10-CM

## 2020-03-25 NOTE — Telephone Encounter (Signed)
Do you want her to come in for follow up?

## 2020-03-26 NOTE — Telephone Encounter (Signed)
She doesn't need to come in for follow up, but need to find out some additional information.  1. Why can't her ophthalmologist order? 2. It looks like her ophthalmologist maybe did order? May 2021? If so then why the delay in imaging? 3. I'm not opposed to ordering, but she will be responsible for getting results to her ophthalmologist.

## 2020-03-28 NOTE — Telephone Encounter (Signed)
Left message to return call to our office.  

## 2020-04-22 ENCOUNTER — Ambulatory Visit: Admission: RE | Admit: 2020-04-22 | Payer: BLUE CROSS/BLUE SHIELD | Source: Ambulatory Visit

## 2020-04-22 ENCOUNTER — Ambulatory Visit: Payer: BLUE CROSS/BLUE SHIELD

## 2020-07-12 ENCOUNTER — Ambulatory Visit: Payer: BLUE CROSS/BLUE SHIELD

## 2020-07-19 ENCOUNTER — Other Ambulatory Visit: Payer: Self-pay

## 2020-07-19 ENCOUNTER — Ambulatory Visit
Admission: RE | Admit: 2020-07-19 | Discharge: 2020-07-19 | Disposition: A | Discharge status: Home / Self Care | Payer: BLUE CROSS/BLUE SHIELD | Source: Ambulatory Visit | Attending: Neurology | Admitting: Neurology

## 2020-07-19 DIAGNOSIS — G35 Multiple sclerosis: Secondary | ICD-10-CM | POA: Diagnosis not present

## 2020-07-19 MED ORDER — GADOBUTROL 1 MMOL/ML IV SOLN
10.0000 mL | Freq: Once | INTRAVENOUS | Status: AC | PRN
  Administered 2020-07-19: 10 mL via INTRAVENOUS

## 2020-09-20 ENCOUNTER — Telehealth: Payer: Self-pay

## 2020-09-20 ENCOUNTER — Encounter: Payer: Self-pay | Admitting: Nurse Practitioner

## 2020-09-20 ENCOUNTER — Other Ambulatory Visit: Payer: Self-pay

## 2020-09-20 ENCOUNTER — Telehealth (INDEPENDENT_AMBULATORY_CARE_PROVIDER_SITE_OTHER): Payer: BLUE CROSS/BLUE SHIELD | Admitting: Nurse Practitioner

## 2020-09-20 VITALS — Wt 277.0 lb

## 2020-09-20 DIAGNOSIS — J3489 Other specified disorders of nose and nasal sinuses: Secondary | ICD-10-CM | POA: Diagnosis not present

## 2020-09-20 MED ORDER — DOXYCYCLINE HYCLATE 100 MG PO TABS: 100.0000 mg | ORAL_TABLET | Freq: Two times a day (BID) | ORAL | 0 refills | Status: AC

## 2020-09-20 NOTE — Progress Notes (Signed)
Patient ID: Angel Reilly, female    DOB: [birth year], 45 y.o.   MRN: [id number]  Virtual visit completed through [employer], a video enabled telemedicine application. Due to national recommendations of social distancing due to COVID-19, a virtual visit is felt to be most appropriate for this patient at this time. Reviewed limitations, risks, security and privacy concerns of performing a virtual visit and the availability of in person appointments. I also reviewed that there may be a patient responsible charge related to this service. The patient agreed to proceed.   Patient location: home Provider location: [place] [facility], office Persons participating in this virtual visit: patient, provider   If any vitals were documented, they were collected by patient at home unless specified below.    Wt 277 lb (125.6 kg) Comment: per patient  LMP 09/05/2020   BMI 43.38 kg/m    CC: sinus pressure Subjective:   HPI: Count[place] Lexa Sura is a 45 y.o. female presenting on 09/20/2020 for   09/16/2020 symptoms started. During conversation it seems some symptoms started prior but resolved. Tested 09/12 and 09/15 negative Covid tests. Has tried Claritin, sudafed, mucinex, nasal saline rinse, robitussin without much relief. Has not been around any sick contacts besides her daughter who had a cold the week prior. Patient has history of sinus infections in the past.      Relevant past medical, surgical, family and social history reviewed and updated as indicated. Interim medical history since our last visit reviewed. Allergies and medications reviewed and updated. Outpatient Medications Prior to Visit  Medication Sig Dispense Refill   modafinil (PROVIGIL) 200 MG tablet Take 1 tablet (200 mg total) by mouth daily. 30 tablet 5   Multiple Vitamins-Minerals (MULTIVITAMIN WITH MINERALS) tablet Take 1 tablet by mouth daily.     ocrelizumab (OCREVUS) 300 MG/10ML injection Inject 600 mg into the vein  every 6 (six) months. [facility] Intraufsion.     Vitamin D, Ergocalciferol, (DRISDOL) 1.25 MG (50000 UNIT) CAPS capsule Take 1 capsule (50,000 Units total) by mouth every 7 (seven) days. 13 capsule 1   erythromycin ophthalmic ointment Place into the left eye 4 (four) times daily.     tiZANidine (ZANAFLEX) 2 MG tablet Take 2 mg by mouth 2 (two) times daily as needed.     No facility-administered medications prior to visit.     Per HPI unless specifically indicated in ROS section below Review of Systems  HENT:  Positive for postnasal drip, sinus pressure and sneezing. Negative for ear discharge, ear pain and sore throat.   Respiratory:  Positive for cough. Negative for shortness of breath.   Cardiovascular:  Negative for chest pain.  Gastrointestinal:  Negative for diarrhea, nausea and vomiting.  Neurological:  Negative for headaches (states not a headache that requires her to take medication).  Objective:  Wt 277 lb (125.6 kg) Comment: per patient  LMP 09/05/2020   BMI 43.38 kg/m   Wt Readings from Last 3 Encounters:  09/20/20 277 lb (125.6 kg)  02/23/20 281 lb (127.5 kg)  07/14/19 244 lb (110.7 kg)       Physical exam: Gen: alert, NAD, not ill appearing Pulm: speaks in complete sentences without increased work of breathing Psych: normal mood, normal thought content      Results for orders placed or performed in visit on 02/23/20  Hemoglobin A1c  Result Value Ref Range   Hgb A1c MFr Bld 5.8 4.6 - 6.5 %  TSH  Result Value Ref Range  TSH 1.79 0.35 - 4.50 uIU/mL  VITAMIN D 25 Hydroxy (Vit-D Deficiency, Fractures)  Result Value Ref Range   VITD 24.34 (L) 30.00 - 100.00 ng/mL  CBC  Result Value Ref Range   WBC 4.1 4.0 - 10.5 K/uL   RBC 4.20 3.87 - 5.11 Mil/uL   Platelets 257.0 150.0 - 400.0 K/uL   Hemoglobin 13.0 12.0 - 15.0 g/dL   HCT 38.1 36.0 - 46.0 %   MCV 90.7 78.0 - 100.0 fl   MCHC 34.2 30.0 - 36.0 g/dL   RDW 12.5 11.5 - 15.5 %  Comprehensive metabolic panel  Result  Value Ref Range   Sodium 137 135 - 145 mEq/L   Potassium 4.2 3.5 - 5.1 mEq/L   Chloride 105 96 - 112 mEq/L   CO2 28 19 - 32 mEq/L   Glucose, Bld 107 (H) 70 - 99 mg/dL   BUN 15 6 - 23 mg/dL   Creatinine, Ser 0.84 0.40 - 1.20 mg/dL   Total Bilirubin 0.6 0.2 - 1.2 mg/dL   Alkaline Phosphatase 67 39 - 117 U/L   AST 14 0 - 37 U/L   ALT 19 0 - 35 U/L   Total Protein 7.0 6.0 - 8.3 g/dL   Albumin 4.0 3.5 - 5.2 g/dL   GFR 85.88 >60.00 mL/min   Calcium 9.4 8.4 - 10.5 mg/dL   Assessment & Plan:   Problem List Items Addressed This Visit       Other   Sinus pressure - Primary    Patient has history of sinus infections.  She is on immunocompromising medications.  Patient did state that before Monday she had some symptoms that went away and then her symptoms came back on Monday and have been increasing and worsening since then patient is on immune compromising medication Ocrevus.  Will elect to treat given patient's immunocompromise state and the increasing/worsening of her symptoms.  Patient has tried Augmentin in the past without great results with switch to Doxy which worked.  Will start doxycycline. Start doxycycline 100 mg twice daily for 7 days.  Patient's last menstrual period was the first week of September.  Did discuss with patient that this medication is not safe during pregnancy.  Patient knowledge understand states she is not sexually active.      Relevant Medications   doxycycline (VIBRA-TABS) 100 MG tablet     No orders of the defined types were placed in this encounter.  No orders of the defined types were placed in this encounter.   I discussed the assessment and treatment plan with the patient. The patient was provided an opportunity to ask questions and all were answered. The patient agreed with the plan and demonstrated an understanding of the instructions. The patient was advised to call back or seek an in-person evaluation if the symptoms worsen or if the condition fails  to improve as anticipated.  Follow up plan: No follow-ups on file.  Romilda Garret, NP

## 2020-09-20 NOTE — Assessment & Plan Note (Signed)
Patient has history of sinus infections.  She is on immunocompromising medications.  Patient did state that before Monday she had some symptoms that went away and then her symptoms came back on Monday and have been increasing and worsening since then patient is on immune compromising medication Ocrevus.  Will elect to treat given patient's immunocompromise state and the increasing/worsening of her symptoms.  Patient has tried Augmentin in the past without great results with switch to Doxy which worked.  Will start doxycycline. Start doxycycline 100 mg twice daily for 7 days.  Patient's last menstrual period was the first week of September.  Did discuss with patient that this medication is not safe during pregnancy.  Patient knowledge understand states she is not sexually active.

## 2020-09-20 NOTE — Telephone Encounter (Signed)
Per appt notes pt already has video visit appt with Romilda Garret NP 09/20/20 at 11:20 am. Sending to Romilda Garret NP, Gentry Fitz NP as PCP and Azalee Course CMA.

## 2020-09-20 NOTE — Telephone Encounter (Signed)
[facility] - Client TELEPHONE ADVICE RECORD [facility] Patient Name: Angel Reilly Gender: Unknown DOB: December 02, 1975 Age: 45 Y 43 M 21 D Return Phone Number: [phone] (Primary), [phone] (Secondary) Address: City/ State/ Zip: [place] [address] Client  [facility] - Client Client Site [facility] Day Physician [facility] - PHYSICIAN, Verita Schneiders- MD Contact Type Call Who Is Calling Patient / Member / Family / Caregiver Call Type Triage / Clinical Relationship To Patient Self Return Phone Number [phone] (Primary) Chief Complaint Facial Pain Reason for Call Symptomatic / Request for [place] began what she thought was allergies/ cold sxs on SUN and has continued to worsen - sinus pressure in head, not draining. Fatigued but no fever. OTC meds not working.Has MS and immuno-suppressant therapy. Covid tested twice neg. Asks what else to do. beginning cough. Additional Comment Is open to virtual appts. Aware of office closure. Translation No Nurse Assessment Nurse: Humfleet, RN, Estill Bamberg Date/Time (Eastern Time): 09/20/2020 9:23:09 AM Confirm and document reason for call. If symptomatic, describe symptoms. ---caller states she has sinus pressure, congestion, headache, sneezing, mild coughing yesterday Does the patient have any new or worsening symptoms? ---Yes Will a triage be completed? ---Yes Related visit to physician within the last 2 weeks? ---No Does the PT have any chronic conditions? (i.e. diabetes, asthma, this includes High risk factors for pregnancy, etc.) ---Yes List chronic conditions. ---MS on immunosuppressive Is the patient pregnant or possibly pregnant? (Ask all females between the ages of 101-55) ---No Is this a behavioral health or substance abuse call? ---No Guidelines Guideline Title Affirmed Question Affirmed Notes Nurse Date/Time  Eilene Ghazi Time) Sinus Pain or Congestion [1] Using nasal washes and pain medicine > 24 hours AND [2] sinus pain Humfleet, RN, Estill Bamberg 09/20/2020 9:24:47 AM PLEASE NOTE: All timestamps contained within this report are represented as [place] Standard Time. CONFIDENTIALTY NOTICE: This fax transmission is intended only for the addressee. It contains information that is legally privileged, confidential or otherwise protected from use or disclosure. If you are not the intended recipient, you are strictly prohibited from reviewing, disclosing, copying using or disseminating any of this information or taking any action in reliance on or regarding this information. If you have received this fax in error, please notify us immediately by telephone so that we can arrange for its return to [place]. Phone: [phone], Toll-Free: [phone], Fax: [phone] Page: 2 of 2 Call Id: [id number] Guidelines Guideline Title Affirmed Question Affirmed Notes Nurse Date/Time Eilene Ghazi Time) (around cheekbone or eye) persists Disp. Time Eilene Ghazi Time) Disposition Final User 09/20/2020 9:27:05 AM SEE PCP WITHIN 3 DAYS Yes Humfleet, RN, Shelly Coss Disagree/Comply Comply Caller Understands Yes PreDisposition Call Doctor Care Advice Given Per Guideline SEE PCP WITHIN 3 DAYS: * You need to be seen within 2 or 3 days. CARE ADVICE given per Sinus Pain or Congestion (Adult) guideline. * You become worse CALL BACK IF: * Difficulty breathing (and not relieved by cleaning out nose) Referrals REFERRED TO PCP OFFICE

## 2020-09-20 NOTE — Telephone Encounter (Signed)
Noted  

## 2020-11-26 ENCOUNTER — Other Ambulatory Visit: Payer: Self-pay | Admitting: Primary Care

## 2020-11-26 DIAGNOSIS — Z1231 Encounter for screening mammogram for malignant neoplasm of breast: Secondary | ICD-10-CM

## 2021-02-05 ENCOUNTER — Other Ambulatory Visit: Payer: Self-pay

## 2021-02-05 ENCOUNTER — Ambulatory Visit
Admission: RE | Admit: 2021-02-05 | Discharge: 2021-02-05 | Disposition: A | Discharge status: Home / Self Care | Payer: BLUE CROSS/BLUE SHIELD | Source: Ambulatory Visit | Attending: Primary Care | Admitting: Primary Care

## 2021-02-05 DIAGNOSIS — Z1231 Encounter for screening mammogram for malignant neoplasm of breast: Secondary | ICD-10-CM | POA: Insufficient documentation

## 2021-02-06 ENCOUNTER — Telehealth: Payer: Self-pay | Admitting: Primary Care

## 2021-02-11 ENCOUNTER — Telehealth: Payer: BLUE CROSS/BLUE SHIELD | Admitting: Primary Care

## 2021-02-13 ENCOUNTER — Telehealth: Payer: BLUE CROSS/BLUE SHIELD | Admitting: Primary Care

## 2021-02-18 ENCOUNTER — Ambulatory Visit: Payer: BLUE CROSS/BLUE SHIELD | Admitting: Primary Care

## 2021-02-24 ENCOUNTER — Ambulatory Visit: Payer: BLUE CROSS/BLUE SHIELD | Admitting: Nurse Practitioner

## 2021-03-25 ENCOUNTER — Other Ambulatory Visit: Payer: Self-pay | Admitting: Neurology

## 2021-03-25 ENCOUNTER — Other Ambulatory Visit (HOSPITAL_COMMUNITY): Payer: Self-pay | Admitting: Neurology

## 2021-03-25 DIAGNOSIS — G35 Multiple sclerosis: Secondary | ICD-10-CM

## 2021-04-19 IMAGING — CT CT ANGIO NECK
2 of 7 series · 8 of 33 positions shown · IV contrast (OMNI 350)
Comparison: Brain MR earlier today

CLINICAL DATA: Stroke



[Series 5: cta neck · axial · 0.46mm/px · z∈[-177,-63]mm · 2 of 171 slices shown]
[im 57/171  soft-tissue]
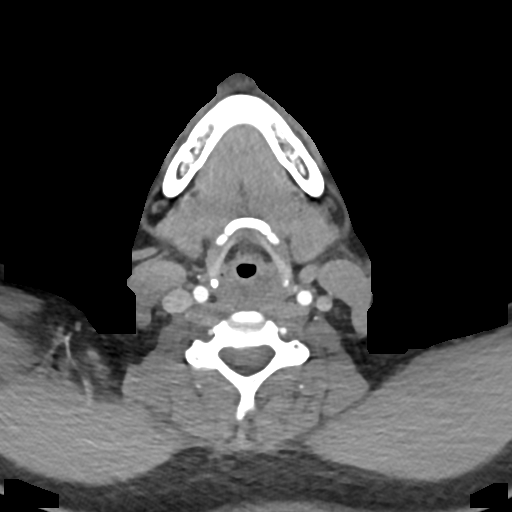
[im 114/171  bone]
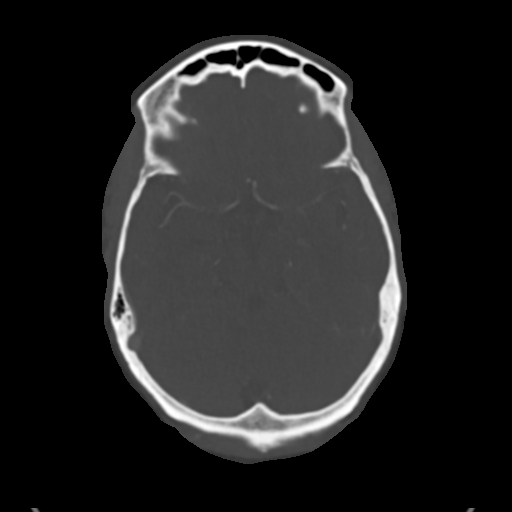

[Series 7: cta neck axial · axial · 0.39mm/px · z∈[-259,-24]mm · 6 of 338 slices shown]
[im 49/338  soft-tissue]
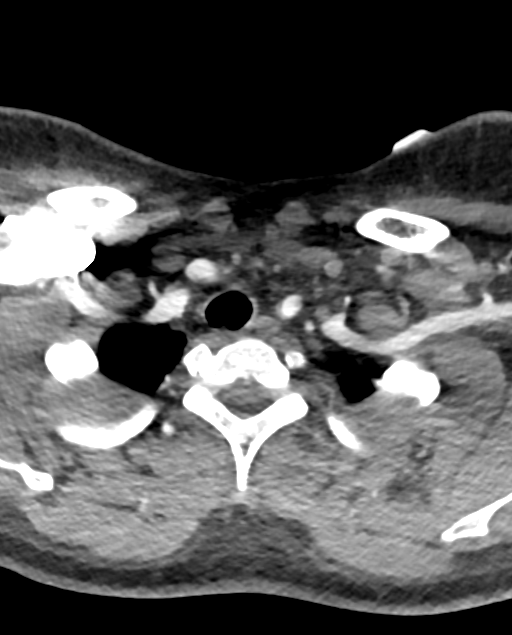
[im 97/338  soft-tissue]
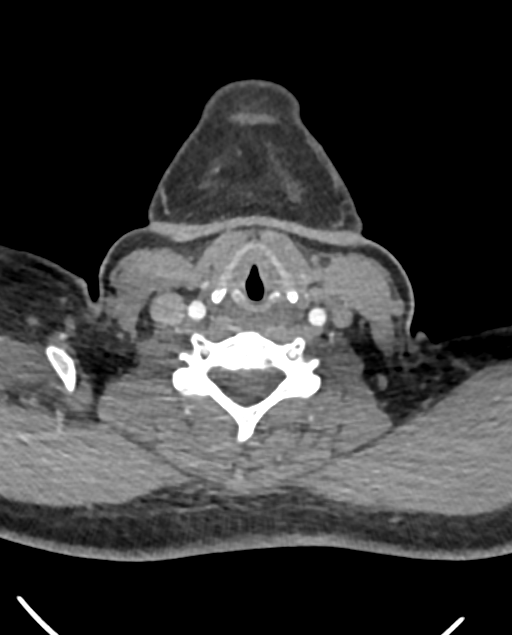
[im 145/338  soft-tissue]
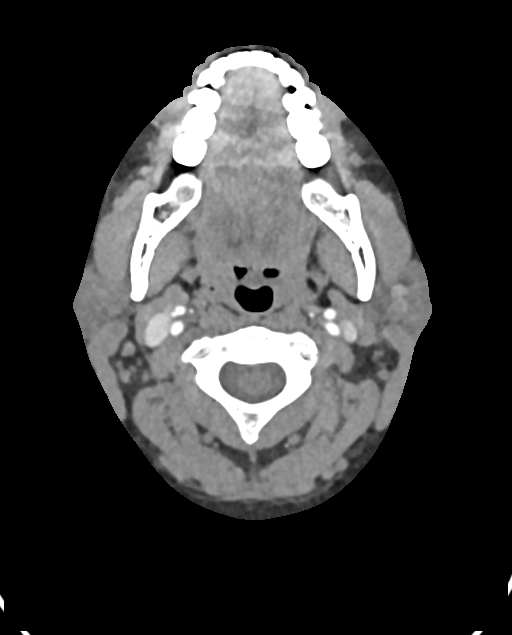
[im 193/338  soft-tissue]
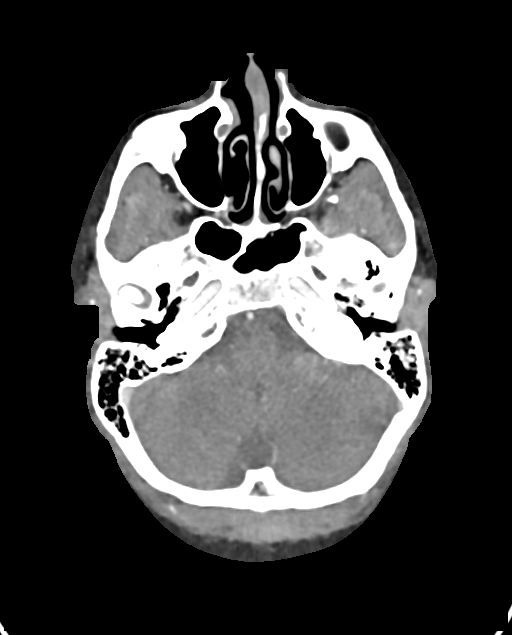
[im 241/338  soft-tissue]
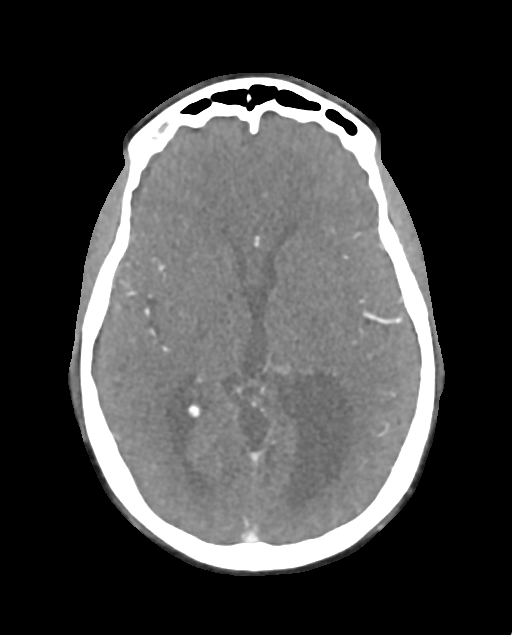
[im 289/338  soft-tissue]
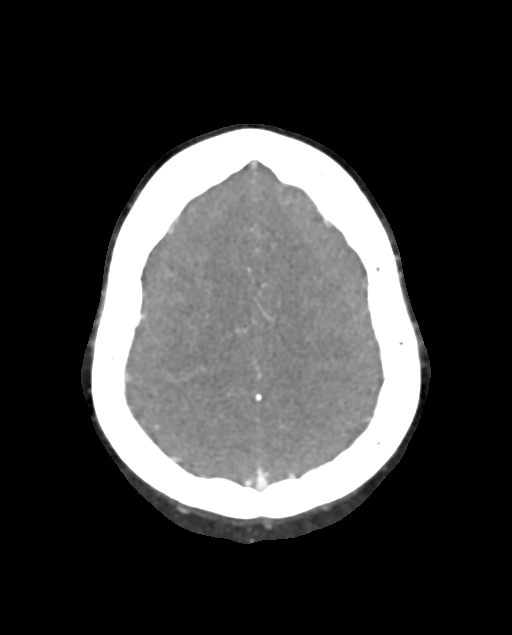

[8 of 33 positions shown; findings below may reference images not displayed]

FINDINGS: CTA NECK FINDINGS

Aortic arch: 3 vessel arch which is negative.

Right carotid system: Vessels are smooth and widely patent. No
atheromatous changes

Left carotid system: Vessels are smooth and widely patent. No
atheromatous changes

Vertebral arteries: No proximal subclavian abnormality. Mild left
vertebral artery dominance.

Skeleton: Negative

Other neck: Negative

Upper chest: Negative

Review of the MIP images confirms the above findings

CTA HEAD FINDINGS

Anterior circulation: Persistent trigeminal artery on the right. No
branch occlusion, beading, or aneurysm. No atheromatous changes.

Posterior circulation: Small basilar artery in the setting of
persistent trigeminal artery on the right. No branch occlusion,
beading, or aneurysm

Venous sinuses: Negative

Anatomic variants: As above

Review of the MIP images confirms the above findings
IMPRESSION: 1. No emergent finding or atherosclerosis. In this patient with no
reported small-vessel risk factors and unusual brainstem location, a
demyelinating/inflammatory cause of the MR abnormality is
increasingly likely.
2. Persistent trigeminal artery on the right.

## 2021-05-02 ENCOUNTER — Ambulatory Visit: Admission: RE | Admit: 2021-05-02 | Payer: BLUE CROSS/BLUE SHIELD | Source: Ambulatory Visit

## 2021-05-23 ENCOUNTER — Telehealth: Payer: BLUE CROSS/BLUE SHIELD | Admitting: Family Medicine

## 2021-05-23 DIAGNOSIS — J40 Bronchitis, not specified as acute or chronic: Secondary | ICD-10-CM | POA: Diagnosis not present

## 2021-05-23 MED ORDER — AZITHROMYCIN 250 MG PO TABS: ORAL_TABLET | ORAL | 0 refills | Status: AC

## 2021-05-23 MED ORDER — BENZONATATE 200 MG PO CAPS: 200.0000 mg | ORAL_CAPSULE | Freq: Two times a day (BID) | ORAL | 0 refills | Status: DC | PRN

## 2021-05-23 NOTE — Progress Notes (Signed)
We are sorry that you are not feeling well.  Here is how we plan to help!  Based on your presentation I believe you most likely have A cough due to a virus.  This is called viral bronchitis and is best treated by rest, plenty of fluids and control of the cough.  You may use Ibuprofen or Tylenol as directed to help your symptoms.     In addition you may use A prescription cough medication called Tessalon Perles 100mg. You may take 1-2 capsules every 8 hours as needed for your cough.    From your responses in the eVisit questionnaire you describe inflammation in the upper respiratory tract which is causing a significant cough.  This is commonly called Bronchitis and has four common causes:   Allergies Viral Infections Acid Reflux Bacterial Infection Allergies, viruses and acid reflux are treated by controlling symptoms or eliminating the cause. An example might be a cough caused by taking certain blood pressure medications. You stop the cough by changing the medication. Another example might be a cough caused by acid reflux. Controlling the reflux helps control the cough.  USE OF BRONCHODILATOR ("RESCUE") INHALERS: There is a risk from using your bronchodilator too frequently.  The risk is that over-reliance on a medication which only relaxes the muscles surrounding the breathing tubes can reduce the effectiveness of medications prescribed to reduce swelling and congestion of the tubes themselves.  Although you feel brief relief from the bronchodilator inhaler, your asthma may actually be worsening with the tubes becoming more swollen and filled with mucus.  This can delay other crucial treatments, such as oral steroid medications. If you need to use a bronchodilator inhaler daily, several times per day, you should discuss this with your provider.  There are probably better treatments that could be used to keep your asthma under control.     HOME CARE Only take medications as instructed by your  medical team. Complete the entire course of an antibiotic. Drink plenty of fluids and get plenty of rest. Avoid close contacts especially the very young and the elderly Cover your mouth if you cough or cough into your sleeve. Always remember to wash your hands A steam or ultrasonic humidifier can help congestion.   GET HELP RIGHT AWAY IF: You develop worsening fever. You become short of breath You cough up blood. Your symptoms persist after you have completed your treatment plan MAKE SURE YOU  Understand these instructions. Will watch your condition. Will get help right away if you are not doing well or get worse.    Thank you for choosing an e-visit.  Your e-visit answers were reviewed by a board certified advanced clinical practitioner to complete your personal care plan. Depending upon the condition, your plan could have included both over the counter or prescription medications.  Please review your pharmacy choice. Make sure the pharmacy is open so you can pick up prescription now. If there is a problem, you may contact your provider through MyChart messaging and have the prescription routed to another pharmacy.  Your safety is important to us. If you have drug allergies check your prescription carefully.   For the next 24 hours you can use MyChart to ask questions about today's visit, request a non-urgent call back, or ask for a work or school excuse. You will get an email in the next two days asking about your experience. I hope that your e-visit has been valuable and will speed your recovery.   I have   provided 5 minutes of non face to face time during this encounter for chart review and documentation.

## 2021-05-23 NOTE — Addendum Note (Signed)
Addended by: Dellia Nims on: 05/23/2021 08:11 PM   Modules accepted: Orders

## 2021-05-30 ENCOUNTER — Ambulatory Visit
Admission: RE | Admit: 2021-05-30 | Discharge: 2021-05-30 | Disposition: A | Discharge status: Home / Self Care | Payer: BLUE CROSS/BLUE SHIELD | Source: Ambulatory Visit | Attending: Neurology | Admitting: Neurology

## 2021-05-30 DIAGNOSIS — G35 Multiple sclerosis: Secondary | ICD-10-CM | POA: Insufficient documentation

## 2021-07-18 NOTE — Telephone Encounter (Signed)
error 

## 2021-08-28 ENCOUNTER — Ambulatory Visit: Payer: BLUE CROSS/BLUE SHIELD | Admitting: Nurse Practitioner

## 2021-10-22 ENCOUNTER — Ambulatory Visit: Payer: BLUE CROSS/BLUE SHIELD | Admitting: Nurse Practitioner

## 2021-11-05 ENCOUNTER — Ambulatory Visit: Payer: BLUE CROSS/BLUE SHIELD | Admitting: Nurse Practitioner

## 2021-11-05 VITALS — BP 130/66 | HR 100 | Temp 97.0°F | Resp 14 | Ht 67.0 in | Wt 284.5 lb

## 2021-11-05 DIAGNOSIS — F411 Generalized anxiety disorder: Secondary | ICD-10-CM

## 2021-11-05 MED ORDER — BUSPIRONE HCL 5 MG PO TABS: 5.0000 mg | ORAL_TABLET | Freq: Two times a day (BID) | ORAL | 0 refills | Status: DC

## 2021-11-05 NOTE — Patient Instructions (Signed)
Nice to see you today We will start the anxiety medication I do not think you have a nail fungus Follow up with Allie Bossier in 1 month for a medication check

## 2021-11-05 NOTE — Progress Notes (Signed)
Acute Office Visit  Subjective:     Patient ID: Angel Reilly, female    DOB: Apr 08, 1975, 46 y.o.   MRN: [id number]  Chief Complaint  Patient presents with   Anxiety    Would like to discuss medication   Nail Problem    Fungal infection on both big toes would like to take a look at     Patient is in today for multiple complaints  Anxiety: States that it has been going on since June. States that it has gotten bad at a time when she was driving to [place] and had to turn around due to a panic attack. States that a few weeks ago she was thinking in bed and started to have a panic attack. States that she does have MS and is better with her walk. Had an MS flare. States that when she is over thinking she has trouble going to sleep. States that she feels anxious every day.  Nail problem : States that it has been going on "forever". States that she is immunosuppressed. States that she has stopped wearing shoes without socks. She has tried topicals, gel, tea tree oil. She does foot soaks  Review of Systems  Constitutional:  Negative for chills and fever.  Respiratory:  Negative for shortness of breath.   Cardiovascular:  Negative for chest pain.  Psychiatric/Behavioral:  Negative for hallucinations and suicidal ideas.         Objective:    BP 130/66   Pulse 100   Temp (!) 97 F (36.1 C) (Temporal)   Resp 14   Ht '5\' 7"'$  (1.702 m)   Wt 284 lb 8 oz (129 kg)   SpO2 98%   BMI 44.56 kg/m  BP Readings from Last 3 Encounters:  11/05/21 130/66  02/23/20 120/64  07/14/19 120/68   Wt Readings from Last 3 Encounters:  11/05/21 284 lb 8 oz (129 kg)  09/20/20 277 lb (125.6 kg)  02/23/20 281 lb (127.5 kg)      Physical Exam Vitals and nursing note reviewed.  Constitutional:      Appearance: Normal appearance.  Neck:     Thyroid: No thyroid mass, thyromegaly or thyroid tenderness.  Cardiovascular:     Rate and Rhythm: Normal rate and regular rhythm.     Heart sounds:  Normal heart sounds.  Pulmonary:     Effort: Pulmonary effort is normal.     Breath sounds: Normal breath sounds.  Feet:     Comments: Nail are good health. Some discoloration compared to patients fingernails. Does not look like a fungal infection of the toe nail Lymphadenopathy:     Cervical: No cervical adenopathy.  Skin:    General: Skin is warm.  Neurological:     Mental Status: She is alert.     No results found for any visits on 11/05/21.      Assessment & Plan:   Problem List Items Addressed This Visit       Other   GAD (generalized anxiety disorder) - Primary    PHQ-9 GAD-7 administered in office.  Patient denies HI/SI/AVH.  States anxiety feels daily we will start patient on buspirone 5 mg twice daily.  Follow-up 1 month with primary care provider.  Also update labs inclusive of thyroid today.  Pending results.      Relevant Medications   busPIRone (BUSPAR) 5 MG tablet   Other Relevant Orders   CBC   Comprehensive metabolic panel   TSH  Meds ordered this encounter  Medications   busPIRone (BUSPAR) 5 MG tablet    Sig: Take 1 tablet (5 mg total) by mouth 2 (two) times daily.    Dispense:  60 tablet    Refill:  0    Order Specific Question:   Supervising Provider    Answer:   Loura Pardon A [[id number]]    Return in about 4 weeks (around 12/03/2021) for With Allie Bossier for GAD recheck .  Romilda Garret, NP

## 2021-11-05 NOTE — Assessment & Plan Note (Signed)
PHQ-9 GAD-7 administered in office.  Patient denies HI/SI/AVH.  States anxiety feels daily we will start patient on buspirone 5 mg twice daily.  Follow-up 1 month with primary care provider.  Also update labs inclusive of thyroid today.  Pending results.

## 2021-11-06 LAB — COMPREHENSIVE METABOLIC PANEL
ALT: 14 U/L (ref 0–35)
AST: 14 U/L (ref 0–37)
Albumin: 4.3 g/dL (ref 3.5–5.2)
Alkaline Phosphatase: 71 U/L (ref 39–117)
BUN: 15 mg/dL (ref 6–23)
CO2: 28 mEq/L (ref 19–32)
Calcium: 9.4 mg/dL (ref 8.4–10.5)
Chloride: 104 mEq/L (ref 96–112)
Creatinine, Ser: 0.87 mg/dL (ref 0.40–1.20)
GFR: 81.35 mL/min (ref >60.00)
Glucose, Bld: 88 mg/dL (ref 70–99)
Potassium: 4.4 mEq/L (ref 3.5–5.1)
Sodium: 140 mEq/L (ref 135–145)
Total Bilirubin: 0.5 mg/dL (ref 0.2–1.2)
Total Protein: 7.3 g/dL (ref 6.0–8.3)

## 2021-11-06 LAB — CBC
HCT: 39.6 % (ref 36.0–46.0)
Hemoglobin: 13.3 g/dL (ref 12.0–15.0)
MCHC: 33.7 g/dL (ref 30.0–36.0)
MCV: 91.5 fl (ref 78.0–100.0)
Platelets: 296 10*3/uL (ref 150.0–400.0)
RBC: 4.32 Mil/uL (ref 3.87–5.11)
RDW: 12.7 % (ref 11.5–15.5)
WBC: 5.3 10*3/uL (ref 4.0–10.5)

## 2021-11-06 LAB — TSH: TSH: 1.85 u[IU]/mL (ref 0.35–5.50)

## 2021-11-30 ENCOUNTER — Other Ambulatory Visit: Payer: Self-pay | Admitting: Nurse Practitioner

## 2021-11-30 DIAGNOSIS — F411 Generalized anxiety disorder: Secondary | ICD-10-CM

## 2021-12-01 ENCOUNTER — Encounter: Payer: Self-pay | Admitting: Primary Care

## 2021-12-12 ENCOUNTER — Encounter: Payer: Self-pay | Admitting: Primary Care

## 2021-12-12 ENCOUNTER — Telehealth (INDEPENDENT_AMBULATORY_CARE_PROVIDER_SITE_OTHER): Payer: BLUE CROSS/BLUE SHIELD | Admitting: Primary Care

## 2021-12-12 VITALS — Ht 67.0 in | Wt 275.0 lb

## 2021-12-12 DIAGNOSIS — G35 Multiple sclerosis: Secondary | ICD-10-CM | POA: Diagnosis not present

## 2021-12-12 DIAGNOSIS — F411 Generalized anxiety disorder: Secondary | ICD-10-CM

## 2021-12-12 MED ORDER — BUSPIRONE HCL 10 MG PO TABS: 10.0000 mg | ORAL_TABLET | Freq: Two times a day (BID) | ORAL | 0 refills | Status: DC

## 2021-12-12 NOTE — Patient Instructions (Signed)
We increased your Buspar to 10 mg twice daily.   Please set up a follow up visit for early 2024.  It was a pleasure to see you today!

## 2021-12-12 NOTE — Progress Notes (Signed)
Patient ID: Angel Reilly, female    DOB: Jan 05, 1976, 46 y.o.   MRN: [id number]  Virtual visit completed through [facility], a video enabled telemedicine application. Due to national recommendations of social distancing due to COVID-19, a virtual visit is felt to be most appropriate for this patient at this time. Reviewed limitations, risks, security and privacy concerns of performing a virtual visit and the availability of in person appointments. I also reviewed that there may be a patient responsible charge related to this service. The patient agreed to proceed.   Patient location: home Provider location: [place] at [facility], office Persons participating in this virtual visit: patient, provider   If any vitals were documented, they were collected by patient at home unless specified below.    Ht '5\' 7"'$  (1.702 m)   Wt 275 lb (124.7 kg)   BMI 43.07 kg/m    CC: Anxiety Subjective:   HPI: Angel Reilly is a 46 y.o. female with a history of multiple sclerosis, anxiety, depression presenting on 12/12/2021 for Anxiety  Chronic history of anxiety and depression and is currently managed on buspirone 5 mg BID per Romilda Garret, NP. She was last evaluated by [facility] on 11/05/21 for increased anxiety since June 2023 with intermittent panic attacks, feeling anxious daily, worrying.   Since her visit she's feeling some better. She has noticed a reduction in her anxiety when driving. She continues to experience anxiety when working. She's compliant to her Buspar twice daily. She denies any side effects from Buspar.   She's feeling much better in terms of her MS. Her pain has reduced and her overall mobility has increased.       Relevant past medical, surgical, family and social history reviewed and updated as indicated. Interim medical history since our last visit reviewed. Allergies and medications reviewed and updated. Outpatient Medications Prior to Visit  Medication Sig Dispense Refill    Multiple Vitamins-Minerals (MULTIVITAMIN WITH MINERALS) tablet Take 1 tablet by mouth daily.     ocrelizumab (OCREVUS) 300 MG/10ML injection Inject 600 mg into the vein every 6 (six) months. GNA Intraufsion.     propranolol (INDERAL) 10 MG tablet Take by mouth.     tiZANidine (ZANAFLEX) 2 MG tablet Take 2 mg by mouth 2 (two) times daily as needed.     topiramate (TOPAMAX) 25 MG tablet      Vitamin D, Ergocalciferol, (DRISDOL) 1.25 MG (50000 UNIT) CAPS capsule Take 1 capsule (50,000 Units total) by mouth every 7 (seven) days. 13 capsule 1   busPIRone (BUSPAR) 5 MG tablet Take 1 tablet (5 mg total) by mouth 2 (two) times daily. 60 tablet 0   No facility-administered medications prior to visit.     Per HPI unless specifically indicated in ROS section below Review of Systems  Gastrointestinal:  Negative for abdominal pain and nausea.  Neurological:  Negative for headaches.  Psychiatric/Behavioral:  The patient is nervous/anxious.        See HPI   Objective:  Ht '5\' 7"'$  (1.702 m)   Wt 275 lb (124.7 kg)   BMI 43.07 kg/m   Wt Readings from Last 3 Encounters:  12/12/21 275 lb (124.7 kg)  11/05/21 284 lb 8 oz (129 kg)  09/20/20 277 lb (125.6 kg)       Physical exam: General: Alert and oriented x 3, no distress, does not appear sickly  Pulmonary: Speaks in complete sentences without increased work of breathing, no cough during visit.  Psychiatric: Normal mood, thought  content, and behavior.     Results for orders placed or performed in visit on 12/01/21  HM PAP SMEAR  Result Value Ref Range   HM Pap smear See report in chart    Assessment & Plan:   Problem List Items Addressed This Visit       Nervous and Auditory   Multiple sclerosis ([place])    Improved!  Following with Neurology. Continue Ocrevus injections every 6 months.         Other   GAD (generalized anxiety disorder) - Primary    Improved but not at goal.  Increase Buspar to 10 mg BID, she agrees. We will plan  to see her for follow up in early 2024.      Relevant Medications   busPIRone (BUSPAR) 10 MG tablet     Meds ordered this encounter  Medications   busPIRone (BUSPAR) 10 MG tablet    Sig: Take 1 tablet (10 mg total) by mouth 2 (two) times daily. For anxiety    Dispense:  180 tablet    Refill:  0    Order Specific Question:   Supervising Provider    Answer:   BEDSOLE, AMY E [[id number]]   No orders of the defined types were placed in this encounter.   I discussed the assessment and treatment plan with the patient. The patient was provided an opportunity to ask questions and all were answered. The patient agreed with the plan and demonstrated an understanding of the instructions. The patient was advised to call back or seek an in-person evaluation if the symptoms worsen or if the condition fails to improve as anticipated.  Follow up plan:  We increased your Buspar to 10 mg twice daily.   Please set up a follow up visit for early 2024.  It was a pleasure to see you today!   Pleas Koch, NP

## 2021-12-12 NOTE — Assessment & Plan Note (Signed)
Improved!  Following with Neurology. Continue Ocrevus injections every 6 months.

## 2021-12-12 NOTE — Assessment & Plan Note (Signed)
Improved but not at goal.  Increase Buspar to 10 mg BID, she agrees. We will plan to see her for follow up in early 2024.

## 2022-01-26 ENCOUNTER — Other Ambulatory Visit: Payer: Self-pay | Admitting: Physician Assistant

## 2022-01-26 DIAGNOSIS — G35 Multiple sclerosis: Secondary | ICD-10-CM

## 2022-02-06 ENCOUNTER — Telehealth: Payer: BLUE CROSS/BLUE SHIELD | Admitting: Physician Assistant

## 2022-02-06 DIAGNOSIS — J019 Acute sinusitis, unspecified: Secondary | ICD-10-CM | POA: Diagnosis not present

## 2022-02-06 DIAGNOSIS — B9689 Other specified bacterial agents as the cause of diseases classified elsewhere: Secondary | ICD-10-CM | POA: Diagnosis not present

## 2022-02-06 MED ORDER — DOXYCYCLINE HYCLATE 100 MG PO TABS: 100.0000 mg | ORAL_TABLET | Freq: Two times a day (BID) | ORAL | 0 refills | Status: DC

## 2022-02-06 NOTE — Patient Instructions (Signed)
Angel Reilly, thank you for joining [name], PA-C for today's virtual visit.  While this provider is not your primary care provider (PCP), if your PCP is located in our provider database this encounter information will be shared with them immediately following your visit.   [facility] account gives you access to today's visit and all your visits, tests, and labs performed at [facility] " click here if you don't have a [facility] account or go to [email or web address]  Consent: (Patient) Angel Reilly provided verbal consent for this virtual visit at the beginning of the encounter.  Current Medications:  Current Outpatient Medications:    doxycycline (VIBRA-TABS) 100 MG tablet, Take 1 tablet (100 mg total) by mouth 2 (two) times daily., Disp: 20 tablet, Rfl: 0   busPIRone (BUSPAR) 10 MG tablet, Take 1 tablet (10 mg total) by mouth 2 (two) times daily. For anxiety, Disp: 180 tablet, Rfl: 0   Multiple Vitamins-Minerals (MULTIVITAMIN WITH MINERALS) tablet, Take 1 tablet by mouth daily., Disp: , Rfl:    ocrelizumab (OCREVUS) 300 MG/10ML injection, Inject 600 mg into the vein every 6 (six) months. GNA Intraufsion., Disp: , Rfl:    propranolol (INDERAL) 10 MG tablet, Take by mouth., Disp: , Rfl:    tiZANidine (ZANAFLEX) 2 MG tablet, Take 2 mg by mouth 2 (two) times daily as needed., Disp: , Rfl:    topiramate (TOPAMAX) 25 MG tablet, , Disp: , Rfl:    Vitamin D, Ergocalciferol, (DRISDOL) 1.25 MG (50000 UNIT) CAPS capsule, Take 1 capsule (50,000 Units total) by mouth every 7 (seven) days., Disp: 13 capsule, Rfl: 1   Medications ordered in this encounter:  Meds ordered this encounter  Medications   doxycycline (VIBRA-TABS) 100 MG tablet    Sig: Take 1 tablet (100 mg total) by mouth 2 (two) times daily.    Dispense:  20 tablet    Refill:  0    Order Specific Question:   Supervising Provider    Answer:   Chase Picket [id number]     *If  you need refills on other medications prior to your next appointment, please contact your pharmacy*  Follow-Up: Call back or seek an in-person evaluation if the symptoms worsen or if the condition fails to improve as anticipated.  [place] [phone]  Other Instructions  Sinus Infection, Adult A sinus infection, also called sinusitis, is inflammation of your sinuses. Sinuses are hollow spaces in the bones around your face. Your sinuses are located: Around your eyes. In the middle of your forehead. Behind your nose. In your cheekbones. Mucus normally drains out of your sinuses. When your nasal tissues become inflamed or swollen, mucus can become trapped or blocked. This allows bacteria, viruses, and fungi to grow, which leads to infection. Most infections of the sinuses are caused by a virus. A sinus infection can develop quickly. It can last for up to 4 weeks (acute) or for more than 12 weeks (chronic). A sinus infection often develops after a cold. What are the causes? This condition is caused by anything that creates swelling in the sinuses or stops mucus from draining. This includes: Allergies. Asthma. Infection from bacteria or viruses. Deformities or blockages in your nose or sinuses. Abnormal growths in the nose (nasal polyps). Pollutants, such as chemicals or irritants in the air. Infection from fungi. This is rare. What increases the risk? You are more likely to develop this condition if you: Have a weak body  defense system (immune system). Do a lot of swimming or diving. Overuse nasal sprays. Smoke. What are the signs or symptoms? The main symptoms of this condition are pain and a feeling of pressure around the affected sinuses. Other symptoms include: Stuffy nose or congestion that makes it difficult to breathe through your nose. Thick yellow or greenish drainage from your nose. Tenderness, swelling, and warmth over the affected sinuses. A cough that  may get worse at night. Decreased sense of smell and taste. Extra mucus that collects in the throat or the back of the nose (postnasal drip) causing a sore throat or bad breath. Tiredness (fatigue). Fever. How is this diagnosed? This condition is diagnosed based on: Your symptoms. Your medical history. A physical exam. Tests to find out if your condition is acute or chronic. This may include: Checking your nose for nasal polyps. Viewing your sinuses using a device that has a light (endoscope). Testing for allergies or bacteria. Imaging tests, such as an MRI or CT scan. In rare cases, a bone biopsy may be done to rule out more serious types of fungal sinus disease. How is this treated? Treatment for a sinus infection depends on the cause and whether your condition is chronic or acute. If caused by a virus, your symptoms should go away on their own within 10 days. You may be given medicines to relieve symptoms. They include: Medicines that shrink swollen nasal passages (decongestants). A spray that eases inflammation of the nostrils (topical intranasal corticosteroids). Rinses that help get rid of thick mucus in your nose (nasal saline washes). Medicines that treat allergies (antihistamines). Over-the-counter pain relievers. If caused by bacteria, your health care provider may recommend waiting to see if your symptoms improve. Most bacterial infections will get better without antibiotic medicine. You may be given antibiotics if you have: A severe infection. A weak immune system. If caused by narrow nasal passages or nasal polyps, surgery may be needed. Follow these instructions at home: Medicines Take, use, or apply over-the-counter and prescription medicines only as told by your health care provider. These may include nasal sprays. If you were prescribed an antibiotic medicine, take it as told by your health care provider. Do not stop taking the antibiotic even if you start to feel  better. Hydrate and humidify  Drink enough fluid to keep your urine pale yellow. Staying hydrated will help to thin your mucus. Use a cool mist humidifier to keep the humidity level in your home above 50%. Inhale steam for 10-15 minutes, 3-4 times a day, or as told by your health care provider. You can do this in the bathroom while a hot shower is running. Limit your exposure to cool or dry air. Rest Rest as much as possible. Sleep with your head raised (elevated). Make sure you get enough sleep each night. General instructions  Apply a warm, moist washcloth to your face 3-4 times a day or as told by your health care provider. This will help with discomfort. Use nasal saline washes as often as told by your health care provider. Wash your hands often with soap and water to reduce your exposure to germs. If soap and water are not available, use hand sanitizer. Do not smoke. Avoid being around people who are smoking (secondhand smoke). Keep all follow-up visits. This is important. Contact a health care provider if: You have a fever. Your symptoms get worse. Your symptoms do not improve within 10 days. Get help right away if: You have a severe headache.  You have persistent vomiting. You have severe pain or swelling around your face or eyes. You have vision problems. You develop confusion. Your neck is stiff. You have trouble breathing. These symptoms may be an emergency. Get help right away. Call 911. Do not wait to see if the symptoms will go away. Do not drive yourself to the hospital. Summary A sinus infection is soreness and inflammation of your sinuses. Sinuses are hollow spaces in the bones around your face. This condition is caused by nasal tissues that become inflamed or swollen. The swelling traps or blocks the flow of mucus. This allows bacteria, viruses, and fungi to grow, which leads to infection. If you were prescribed an antibiotic medicine, take it as told by your  health care provider. Do not stop taking the antibiotic even if you start to feel better. Keep all follow-up visits. This is important. This information is not intended to replace advice given to you by your health care provider. Make sure you discuss any questions you have with your health care provider. Document Revised: 11/26/2020 Document Reviewed: 11/26/2020 Elsevier Patient Education  [place].    If you have been instructed to have an in-person evaluation today at a local Urgent Care facility, please use the link below. It will take you to a list of all of our available [facility], including address, phone number and hours of operation. Please do not delay care.  [facility]  If you or a family member do not have a primary care provider, use the link below to schedule a visit and establish care. When you choose a [facility] primary care physician or advanced practice provider, you gain a long-term partner in health. Find a Primary Care Provider  Learn more about 's in-office and virtual care options: [facility]

## 2022-02-06 NOTE — Progress Notes (Signed)
Virtual Visit Consent   Angel Reilly, you are scheduled for a virtual visit with a [facility] provider today. Just as with appointments in the office, your consent must be obtained to participate. Your consent will be active for this visit and any virtual visit you may have with one of our providers in the next 365 days. If you have a MyChart account, a copy of this consent can be sent to you electronically.  As this is a virtual visit, video technology does not allow for your provider to perform a traditional examination. This may limit your provider's ability to fully assess your condition. If your provider identifies any concerns that need to be evaluated in person or the need to arrange testing (such as labs, EKG, etc.), we will make arrangements to do so. Although advances in technology are sophisticated, we cannot ensure that it will always work on either your end or our end. If the connection with a video visit is poor, the visit may have to be switched to a telephone visit. With either a video or telephone visit, we are not always able to ensure that we have a secure connection.  By engaging in this virtual visit, you consent to the provision of healthcare and authorize for your insurance to be billed (if applicable) for the services provided during this visit. Depending on your insurance coverage, you may receive a charge related to this service.  I need to obtain your verbal consent now. Are you willing to proceed with your visit today? Angel Reilly has provided verbal consent on 02/06/2022 for a virtual visit (video or telephone). [name], PA-C  Date: 02/06/2022 9:07 AM  Virtual Visit via Video Note   I, [name], connected with  Angel Reilly  ([id number], [birth year]) on 02/06/22 at  9:00 AM EST by a video-enabled telemedicine application and verified that I am speaking with the correct person using two identifiers.  Location: Patient: Virtual Visit  Location Patient: Home Provider: Virtual Visit Location Provider: Home Office   I discussed the limitations of evaluation and management by telemedicine and the availability of in person appointments. The patient expressed understanding and agreed to proceed.    History of Present Illness: Angel Reilly is a 47 y.o. who identifies as a female who was assigned female at birth, and is being seen today for URI symptoms.  HPI: URI  This is a new problem. The current episode started in the past 7 days. The problem has been gradually worsening. Maximum temperature: felt warm today. The fever has been present for Less than 1 day. Associated symptoms include congestion, coughing (bronchial), headaches, rhinorrhea and sinus pain. Pertinent negatives include no ear pain, plugged ear sensation or sore throat. Associated symptoms comments: Myalgias, increased fatigue. She has tried decongestant, antihistamine, increased fluids and sleep for the symptoms. The treatment provided no relief.  Negative at home test for covid 19   PMH: MS   Problems:  Patient Active Problem List   Diagnosis Date Noted   GAD (generalized anxiety disorder) 11/05/2021   Sinus pressure 09/20/2020   Morbid obesity with BMI of 40.0-44.9, adult ([place]) 02/23/2020   Peripheral edema 02/23/2020   Multiple sclerosis ([place]) 07/03/2019   High risk medication use 07/03/2019   Abnormal brain MRI 05/24/2019   Diplopia 05/24/2019   Balance disorder 05/24/2019   Low vitamin D level 05/24/2019   Hypersomnia 05/24/2019   Vaginal discharge 12/07/2018   Skin mass 12/07/2018   Anxiety  and depression 06/07/2017   Obesity (BMI 35.0-39.9 without comorbidity) 06/07/2017   Atypical squamous cells of undetermined significance (ASC-US) on cervical Pap smear 03/09/2014   Carpal tunnel syndrome, unspecified upper limb 04/28/2010    Allergies: No Known Allergies Medications:  Current Outpatient Medications:    doxycycline (VIBRA-TABS) 100 MG  tablet, Take 1 tablet (100 mg total) by mouth 2 (two) times daily., Disp: 20 tablet, Rfl: 0   busPIRone (BUSPAR) 10 MG tablet, Take 1 tablet (10 mg total) by mouth 2 (two) times daily. For anxiety, Disp: 180 tablet, Rfl: 0   Multiple Vitamins-Minerals (MULTIVITAMIN WITH MINERALS) tablet, Take 1 tablet by mouth daily., Disp: , Rfl:    ocrelizumab (OCREVUS) 300 MG/10ML injection, Inject 600 mg into the vein every 6 (six) months. GNA Intraufsion., Disp: , Rfl:    propranolol (INDERAL) 10 MG tablet, Take by mouth., Disp: , Rfl:    tiZANidine (ZANAFLEX) 2 MG tablet, Take 2 mg by mouth 2 (two) times daily as needed., Disp: , Rfl:    topiramate (TOPAMAX) 25 MG tablet, , Disp: , Rfl:    Vitamin D, Ergocalciferol, (DRISDOL) 1.25 MG (50000 UNIT) CAPS capsule, Take 1 capsule (50,000 Units total) by mouth every 7 (seven) days., Disp: 13 capsule, Rfl: 1  Observations/Objective: Patient is well-developed, well-nourished in no acute distress.  Resting comfortably at home.  Head is normocephalic, atraumatic.  No labored breathing.  Speech is clear and coherent with logical content.  Patient is alert and oriented at baseline.    Assessment and Plan: 1. Acute bacterial sinusitis - doxycycline (VIBRA-TABS) 100 MG tablet; Take 1 tablet (100 mg total) by mouth 2 (two) times daily.  Dispense: 20 tablet; Refill: 0  - Worsening symptoms that have not responded to OTC medications.  - Will give Doxycycline - Continue allergy medications.  - Steam and humidifier can help - Stay well hydrated and get plenty of rest.  - Seek in person evaluation if no symptom improvement or if symptoms worsen   Follow Up Instructions: I discussed the assessment and treatment plan with the patient. The patient was provided an opportunity to ask questions and all were answered. The patient agreed with the plan and demonstrated an understanding of the instructions.  A copy of instructions were sent to the patient via MyChart unless  otherwise noted below.    The patient was advised to call back or seek an in-person evaluation if the symptoms worsen or if the condition fails to improve as anticipated.  Time:  I spent 9 minutes with the patient via telehealth technology discussing the above problems/concerns.    [name], PA-C

## 2022-02-27 ENCOUNTER — Ambulatory Visit: Payer: BLUE CROSS/BLUE SHIELD

## 2022-02-27 ENCOUNTER — Other Ambulatory Visit: Payer: BLUE CROSS/BLUE SHIELD

## 2022-03-02 ENCOUNTER — Ambulatory Visit: Payer: BLUE CROSS/BLUE SHIELD

## 2022-03-16 ENCOUNTER — Ambulatory Visit
Admission: RE | Admit: 2022-03-16 | Discharge: 2022-03-16 | Disposition: A | Discharge status: Home / Self Care | Payer: BLUE CROSS/BLUE SHIELD | Source: Ambulatory Visit | Attending: Physician Assistant | Admitting: Physician Assistant

## 2022-03-16 DIAGNOSIS — G35 Multiple sclerosis: Secondary | ICD-10-CM | POA: Diagnosis present

## 2022-03-16 MED ORDER — GADOBUTROL 1 MMOL/ML IV SOLN
10.0000 mL | Freq: Once | INTRAVENOUS | Status: AC | PRN
  Administered 2022-03-16: 10 mL via INTRAVENOUS

## 2022-03-23 ENCOUNTER — Other Ambulatory Visit: Payer: Self-pay | Admitting: Primary Care

## 2022-03-23 DIAGNOSIS — F411 Generalized anxiety disorder: Secondary | ICD-10-CM

## 2022-03-23 NOTE — Telephone Encounter (Signed)
Patient is due for follow up, please schedule, thank you!   Please schedule in a 40 min slot, thanks!

## 2022-03-24 NOTE — Telephone Encounter (Signed)
LVM for patient to callback and schedule,

## 2022-03-27 ENCOUNTER — Telehealth: Payer: Self-pay

## 2022-03-27 ENCOUNTER — Telehealth: Payer: BLUE CROSS/BLUE SHIELD | Admitting: Primary Care

## 2022-03-27 DIAGNOSIS — Z1231 Encounter for screening mammogram for malignant neoplasm of breast: Secondary | ICD-10-CM

## 2022-03-27 NOTE — Telephone Encounter (Signed)
Patient would like mammogram order put in for [place].   Follow up scheduled in office for 03/31/22.

## 2022-03-27 NOTE — Telephone Encounter (Signed)
Noted, orders placed. 

## 2022-03-31 ENCOUNTER — Ambulatory Visit: Payer: BLUE CROSS/BLUE SHIELD | Admitting: Primary Care

## 2022-04-01 ENCOUNTER — Ambulatory Visit: Payer: BLUE CROSS/BLUE SHIELD | Admitting: Nurse Practitioner

## 2022-04-02 ENCOUNTER — Encounter: Payer: Self-pay | Admitting: Primary Care

## 2022-04-08 NOTE — Telephone Encounter (Signed)
Amy, can we remove the No Show fee for this patient on 04/01/22?

## 2022-06-26 ENCOUNTER — Other Ambulatory Visit: Payer: Self-pay | Admitting: Primary Care

## 2022-06-26 DIAGNOSIS — F411 Generalized anxiety disorder: Secondary | ICD-10-CM

## 2022-08-21 DIAGNOSIS — N879 Dysplasia of cervix uteri, unspecified: Secondary | ICD-10-CM | POA: Diagnosis not present

## 2022-09-28 ENCOUNTER — Telehealth: Payer: Medicaid Other | Admitting: Nurse Practitioner

## 2022-09-28 DIAGNOSIS — S90561A Insect bite (nonvenomous), right ankle, initial encounter: Secondary | ICD-10-CM | POA: Diagnosis not present

## 2022-09-28 DIAGNOSIS — W57XXXA Bitten or stung by nonvenomous insect and other nonvenomous arthropods, initial encounter: Secondary | ICD-10-CM

## 2022-09-28 MED ORDER — CEPHALEXIN 500 MG PO CAPS: 500.0000 mg | ORAL_CAPSULE | Freq: Four times a day (QID) | ORAL | 0 refills | Status: AC

## 2022-09-28 MED ORDER — CHLORHEXIDINE GLUCONATE 4 % EX SOLN: Freq: Every day | CUTANEOUS | 0 refills | Status: AC | PRN

## 2022-09-28 NOTE — Progress Notes (Signed)
Virtual Visit Consent   Angel Reilly, you are scheduled for a virtual visit with a [facility] provider today. Just as with appointments in the office, your consent must be obtained to participate. Your consent will be active for this visit and any virtual visit you may have with one of our providers in the next 365 days. If you have a MyChart account, a copy of this consent can be sent to you electronically.  As this is a virtual visit, video technology does not allow for your provider to perform a traditional examination. This may limit your provider's ability to fully assess your condition. If your provider identifies any concerns that need to be evaluated in person or the need to arrange testing (such as labs, EKG, etc.), we will make arrangements to do so. Although advances in technology are sophisticated, we cannot ensure that it will always work on either your end or our end. If the connection with a video visit is poor, the visit may have to be switched to a telephone visit. With either a video or telephone visit, we are not always able to ensure that we have a secure connection.  By engaging in this virtual visit, you consent to the provision of healthcare and authorize for your insurance to be billed (if applicable) for the services provided during this visit. Depending on your insurance coverage, you may receive a charge related to this service.  I need to obtain your verbal consent now. Are you willing to proceed with your visit today? Angel Morton Roessner has provided verbal consent on 09/28/2022 for a virtual visit (video or telephone). Viviano Simas, FNP  Date: 09/28/2022 8:40 AM  Virtual Visit via Video Note   I, Viviano Simas, connected with  Angel Reilly  ([id number], 1975-05-22) on 09/28/22 at  8:45 AM EDT by a video-enabled telemedicine application and verified that I am speaking with the correct person using two identifiers.  Location: Patient: Virtual Visit Location Patient:  Home Provider: Virtual Visit Location Provider: Home Office   I discussed the limitations of evaluation and management by telemedicine and the availability of in person appointments. The patient expressed understanding and agreed to proceed.    History of Present Illness: Angel Reilly is a 47 y.o. who identifies as a female who was assigned female at birth, and is being seen today for a spider bite.  She was bit 09/25/22 at night  Bit on ankle while taking out the garbage Didn't get a good look at the spider because it was dark outside  No darkening of skin slight redness  Area was itchy the first night, then started to get painful  Pain today is 5/10 Today there is a blister on the bite area   She has MS  Denies any delayed healing in wounds or MRSA   Problems:  Patient Active Problem List   Diagnosis Date Noted   GAD (generalized anxiety disorder) 11/05/2021   Sinus pressure 09/20/2020   Morbid obesity with BMI of 40.0-44.9, adult (HCC) 02/23/2020   Peripheral edema 02/23/2020   Multiple sclerosis (HCC) 07/03/2019   High risk medication use 07/03/2019   Abnormal brain MRI 05/24/2019   Diplopia 05/24/2019   Balance disorder 05/24/2019   Low vitamin D level 05/24/2019   Hypersomnia 05/24/2019   Vaginal discharge 12/07/2018   Skin mass 12/07/2018   Anxiety and depression 06/07/2017   Obesity (BMI 35.0-39.9 without comorbidity) 06/07/2017   Atypical squamous cells of undetermined significance (ASC-US) on cervical  Pap smear 03/09/2014   Carpal tunnel syndrome, unspecified upper limb 04/28/2010    Allergies: No Known Allergies Medications:  Current Outpatient Medications:    busPIRone (BUSPAR) 10 MG tablet, TAKE 1 TABLET (10 MG TOTAL) BY MOUTH 2 (TWO) TIMES DAILY. FOR ANXIETY, Disp: 180 tablet, Rfl: 0   doxycycline (VIBRA-TABS) 100 MG tablet, Take 1 tablet (100 mg total) by mouth 2 (two) times daily., Disp: 20 tablet, Rfl: 0   Multiple Vitamins-Minerals (MULTIVITAMIN WITH  MINERALS) tablet, Take 1 tablet by mouth daily., Disp: , Rfl:    ocrelizumab (OCREVUS) 300 MG/10ML injection, Inject 600 mg into the vein every 6 (six) months. GNA Intraufsion., Disp: , Rfl:    propranolol (INDERAL) 10 MG tablet, Take by mouth., Disp: , Rfl:    tiZANidine (ZANAFLEX) 2 MG tablet, Take 2 mg by mouth 2 (two) times daily as needed., Disp: , Rfl:    topiramate (TOPAMAX) 25 MG tablet, , Disp: , Rfl:    Vitamin D, Ergocalciferol, (DRISDOL) 1.25 MG (50000 UNIT) CAPS capsule, Take 1 capsule (50,000 Units total) by mouth every 7 (seven) days., Disp: 13 capsule, Rfl: 1  Observations/Objective: Patient is well-developed, well-nourished in no acute distress.  Resting comfortably  at home.  Head is normocephalic, atraumatic.  No labored breathing.  Speech is clear and coherent with logical content.  Patient is alert and oriented at baseline.  Clear fluid filled blister to right ankle approximately the size of eraser/pencil top  No surrounding erythema skin color otherwise uniform    Assessment and Plan:  1. Insect bite of right ankle, initial encounter  Meds ordered this encounter  Medications   cephALEXin (KEFLEX) 500 MG capsule    Sig: Take 1 capsule (500 mg total) by mouth 4 (four) times daily for 5 days.    Dispense:  20 capsule    Refill:  0   chlorhexidine (HIBICLENS) 4 % external liquid    Sig: Apply topically daily as needed for up to 5 days. Wash wound daily as needed until area has resolved    Dispense:  236 mL    Refill:  0      Strict follow up precautions discussed for worsening symptoms  Swelling of extremity, pain, redness or darkening of the skin or ulcerations forming    Follow Up Instructions: I discussed the assessment and treatment plan with the patient. The patient was provided an opportunity to ask questions and all were answered. The patient agreed with the plan and demonstrated an understanding of the instructions.  A copy of instructions were sent to  the patient via MyChart unless otherwise noted below.    The patient was advised to call back or seek an in-person evaluation if the symptoms worsen or if the condition fails to improve as anticipated.  Time:  I spent 11 minutes with the patient via telehealth technology discussing the above problems/concerns.    Viviano Simas, FNP

## 2022-10-18 DIAGNOSIS — Z79899 Other long term (current) drug therapy: Secondary | ICD-10-CM | POA: Diagnosis not present

## 2022-10-18 DIAGNOSIS — G35 Multiple sclerosis: Secondary | ICD-10-CM | POA: Diagnosis not present

## 2022-10-29 ENCOUNTER — Ambulatory Visit: Payer: Medicaid Other | Admitting: Primary Care

## 2022-10-29 ENCOUNTER — Encounter: Payer: Self-pay | Admitting: Primary Care

## 2022-10-29 VITALS — BP 120/78 | HR 97 | Temp 98.6°F | Ht 67.0 in | Wt 279.0 lb

## 2022-10-29 DIAGNOSIS — Z1231 Encounter for screening mammogram for malignant neoplasm of breast: Secondary | ICD-10-CM

## 2022-10-29 DIAGNOSIS — R7989 Other specified abnormal findings of blood chemistry: Secondary | ICD-10-CM | POA: Diagnosis not present

## 2022-10-29 DIAGNOSIS — G35 Multiple sclerosis: Secondary | ICD-10-CM

## 2022-10-29 DIAGNOSIS — F411 Generalized anxiety disorder: Secondary | ICD-10-CM | POA: Diagnosis not present

## 2022-10-29 DIAGNOSIS — Z23 Encounter for immunization: Secondary | ICD-10-CM

## 2022-10-29 DIAGNOSIS — H10403 Unspecified chronic conjunctivitis, bilateral: Secondary | ICD-10-CM

## 2022-10-29 DIAGNOSIS — R7303 Prediabetes: Secondary | ICD-10-CM | POA: Diagnosis not present

## 2022-10-29 DIAGNOSIS — H109 Unspecified conjunctivitis: Secondary | ICD-10-CM | POA: Insufficient documentation

## 2022-10-29 LAB — LIPID PANEL
Cholesterol: 137 mg/dL (ref 0–200)
HDL: 47.8 mg/dL (ref >39.00)
LDL Cholesterol: 80 mg/dL (ref 0–99)
NonHDL: 88.74
Total CHOL/HDL Ratio: 3
Triglycerides: 42 mg/dL (ref 0.0–149.0)
VLDL: 8.4 mg/dL (ref 0.0–40.0)

## 2022-10-29 LAB — COMPREHENSIVE METABOLIC PANEL
ALT: 14 U/L (ref 0–35)
AST: 14 U/L (ref 0–37)
Albumin: 4.4 g/dL (ref 3.5–5.2)
Alkaline Phosphatase: 70 U/L (ref 39–117)
BUN: 17 mg/dL (ref 6–23)
CO2: 26 meq/L (ref 19–32)
Calcium: 9.2 mg/dL (ref 8.4–10.5)
Chloride: 104 meq/L (ref 96–112)
Creatinine, Ser: 0.88 mg/dL (ref 0.40–1.20)
GFR: 79.7 mL/min (ref >60.00)
Glucose, Bld: 114 mg/dL — ABNORMAL HIGH (ref 70–99)
Potassium: 3.9 meq/L (ref 3.5–5.1)
Sodium: 137 meq/L (ref 135–145)
Total Bilirubin: 0.7 mg/dL (ref 0.2–1.2)
Total Protein: 7.2 g/dL (ref 6.0–8.3)

## 2022-10-29 LAB — HEMOGLOBIN A1C: Hgb A1c MFr Bld: 5.9 % (ref 4.6–6.5)

## 2022-10-29 LAB — VITAMIN D 25 HYDROXY (VIT D DEFICIENCY, FRACTURES): VITD: 18.58 ng/mL — ABNORMAL LOW (ref 30.00–100.00)

## 2022-10-29 MED ORDER — BUSPIRONE HCL 10 MG PO TABS: 10.0000 mg | ORAL_TABLET | Freq: Two times a day (BID) | ORAL | 3 refills | Status: AC

## 2022-10-29 MED ORDER — POLYMYXIN B-TRIMETHOPRIM 10000-0.1 UNIT/ML-% OP SOLN: 1.0000 [drp] | Freq: Four times a day (QID) | OPHTHALMIC | 0 refills | Status: AC

## 2022-10-29 NOTE — Assessment & Plan Note (Signed)
Uncontrolled.  Resume buspirone 10 mg once to twice daily. Refills provided.

## 2022-10-29 NOTE — Patient Instructions (Signed)
Stop by the lab prior to leaving today. I will notify you of your results once received.   Use the drops as needed for infection. Call your eye doctor for an appointment.   Resume buspirone 10 mg tablets once or twice daily for anxiety.  Mammogram company should call you regarding an appointment.  Notify me in 2 weeks if you have not heard from them.  It was a pleasure to see you today!

## 2022-10-29 NOTE — Assessment & Plan Note (Signed)
Repeat vitamin D level pending. 

## 2022-10-29 NOTE — Assessment & Plan Note (Signed)
Following with neurology through [facility], infusion notes reviewed from October 2024 through [facility]. Continue Ocrevus 300 mg infusions as prescribed.

## 2022-10-29 NOTE — Assessment & Plan Note (Signed)
Exam today benign. Recommended to schedule an appoint with her eye doctor.  Prescription for trimethoprim-polymyxin B drops provided.  Instructions discussed on use.

## 2022-10-29 NOTE — Progress Notes (Signed)
Subjective:    Patient ID: Angel Reilly, female    DOB: [birth year], 47 y.o.   MRN: [id number]  HPI  Angel Reilly is a very pleasant 47 y.o. female with a history of multiple sclerosis, diplopia, GAD, carpal tunnel syndrome who presents today for follow up of chronic conditions.   She is also due for tetanus vaccine.  1) Multiple Sclerosis: Following with neurology, Dr. Robb Matar, through [facility] system.  Currently managed on Ocrevus 600 mg IV every 6 months. Last infusion was 10/18/22.   She is also managed on tizanidine 2 mg PRN, modafinil 200 mg once daily as needed, topiramate 25 mg. She doesn't take the as needed medications often.   2) GAD: Previously managed on buspirone 10 mg BID. She mostly experiences anxiety with driving. Given her MS, she feels nervous about driving, feels like she's moving slower than traffic. Outside of driving, she does experience anxiety in general life with symptoms of over thinking things, anxiety about her obesity, avoiding social situations because of her weight.   3) Conjunctivitis: Chronic for the last 1 year. Intermittent, occurs to both eyes at different times. Her most recent infection was two weeks ago to the left eye which did migrate to the right eye. Symptoms include yellow mucous, eyes matted shut, eye irritation. She denies itching, sneezing, rhinorrhea. She is a contact wearer, but now mostly wears glasses. Today she's noticed improvement in mucous, has been using OTC allergy drops. She does see an eye doctor, no recent appointment.    Review of Systems  Eyes:  Positive for discharge and redness. Negative for itching.  Respiratory:  Negative for shortness of breath.   Cardiovascular:  Negative for chest pain.  Gastrointestinal:  Negative for constipation and diarrhea.  Neurological:  Negative for headaches.  Psychiatric/Behavioral:  The patient is nervous/anxious.          Past Medical History:  Diagnosis Date   Abnormal  brain MRI 05/24/2019   Abnormal Pap smear of cervix    History of cervical dysplasia 01/15/2017   Pap smear abnormality of cervix/human papillomavirus (HPV) positive    Peripheral edema 02/23/2020   Post partum depression     Social History   Socioeconomic History   Marital status: Single    Spouse name: Not on file   Number of children: 1   Years of education: Not on file   Highest education level: Not on file  Occupational History   Not on file  Tobacco Use   Smoking status: Never   Smokeless tobacco: Never  Vaping Use   Vaping status: Never Used  Substance and Sexual Activity   Alcohol use: Yes    Comment: wine 1-2 per month   Drug use: No   Sexual activity: Yes    Birth control/protection: None  Other Topics Concern   Not on file  Social History Narrative   Caffeine use: Soda daily   Single.   1 child.   Works as an Systems developer.    Chemical engineer Strain: Not on [employer] Insecurity: Not on file  Transportation Needs: Not on file  Physical Activity: Not on file  Stress: Not on file  Social Connections: Unknown (05/18/2021)   Received from [facility]   Social Network    Social Network: Not on file  Intimate Partner Violence: Unknown (04/10/2021)   Received from [facility], [facility]   HITS    Physically Hurt:  Not on file    Insult or Talk Down To: Not on file    Threaten Physical Harm: Not on file    Scream or Curse: Not on file    Past Surgical History:  Procedure Laterality Date   BREAST BIOPSY Left 2000   benign results   BREAST BIOPSY Left 2018   us/ bx/clip-neg fibradenoma   DILATION AND CURETTAGE OF UTERUS     WISDOM TOOTH EXTRACTION      Family History  Problem Relation Age of Onset   Breast cancer Maternal Aunt        mid 60's   Colon cancer Maternal Aunt    Depression Mother    Hypertension Mother    Heart disease Father    Heart attack Father    Hypertension Father     Depression Brother    Heart disease Maternal Grandmother    Hypertension Maternal Grandmother     No Known Allergies  Current Outpatient Medications on File Prior to Visit  Medication Sig Dispense Refill   modafinil (PROVIGIL) 200 MG tablet Take by mouth.     Multiple Vitamins-Minerals (MULTIVITAMIN WITH MINERALS) tablet Take 1 tablet by mouth daily.     ocrelizumab (OCREVUS) 300 MG/10ML injection Inject 600 mg into the vein every 6 (six) months. GNA Intraufsion.     propranolol (INDERAL) 10 MG tablet Take by mouth.     tiZANidine (ZANAFLEX) 2 MG tablet Take 2 mg by mouth 2 (two) times daily as needed.     topiramate (TOPAMAX) 25 MG tablet      Vitamin D, Ergocalciferol, (DRISDOL) 1.25 MG (50000 UNIT) CAPS capsule Take 1 capsule (50,000 Units total) by mouth every 7 (seven) days. (Patient not taking: Reported on 10/29/2022) 13 capsule 1   No current facility-administered medications on file prior to visit.    BP 120/78   Pulse 97   Temp 98.6 F (37 C) (Temporal)   Ht 5\' 7"  (1.702 m)   Wt 279 lb (126.6 kg)   SpO2 98%   BMI 43.70 kg/m  Objective:   Physical Exam Eyes:     General: Lids are normal.        Right eye: No discharge.        Left eye: No discharge.     Conjunctiva/sclera: Conjunctivae normal.     Right eye: Right conjunctiva is not injected. No exudate.    Left eye: Left conjunctiva is not injected. No exudate. Cardiovascular:     Rate and Rhythm: Normal rate and regular rhythm.  Pulmonary:     Effort: Pulmonary effort is normal.     Breath sounds: Normal breath sounds.  Musculoskeletal:     Cervical back: Neck supple.  Skin:    General: Skin is warm and dry.  Neurological:     Mental Status: She is alert and oriented to person, place, and time.  Psychiatric:        Mood and Affect: Mood normal.           Assessment & Plan:  Multiple sclerosis [facility]) Assessment & Plan: Following with neurology through [facility], infusion notes reviewed from October 2024  through [facility]. Continue Ocrevus 300 mg infusions as prescribed.    GAD (generalized anxiety disorder) Assessment & Plan: Uncontrolled.  Resume buspirone 10 mg once to twice daily. Refills provided.  Orders: -     busPIRone HCl; Take 1 tablet (10 mg total) by mouth 2 (two) times daily. For anxiety  Dispense: 180 tablet; Refill:  3  Low vitamin D level Assessment & Plan: Repeat vitamin D level pending.  Orders: -     VITAMIN D 25 Hydroxy (Vit-D Deficiency, Fractures)  Chronic conjunctivitis of both eyes, unspecified chronic conjunctivitis type Assessment & Plan: Exam today benign. Recommended to schedule an appoint with her eye doctor.  Prescription for trimethoprim-polymyxin B drops provided.  Instructions discussed on use.   Orders: -     Polymyxin B-Trimethoprim; Place 1 drop into both eyes every 6 (six) hours.  Dispense: 10 mL; Refill: 0  Screening mammogram for breast cancer -     3D Screening Mammogram, Left and Right; Future  Prediabetes Assessment & Plan: Repeat A1c pending.  Orders: -     Lipid panel -     Hemoglobin A1c -     Comprehensive metabolic panel        Doreene Nest, NP

## 2022-10-29 NOTE — Assessment & Plan Note (Signed)
Repeat A1c pending. 

## 2022-10-30 ENCOUNTER — Other Ambulatory Visit: Payer: Self-pay | Admitting: Primary Care

## 2022-10-30 DIAGNOSIS — R7989 Other specified abnormal findings of blood chemistry: Secondary | ICD-10-CM

## 2022-10-30 MED ORDER — VITAMIN D (ERGOCALCIFEROL) 1.25 MG (50000 UNIT) PO CAPS: ORAL_CAPSULE | ORAL | 0 refills | Status: AC

## 2022-11-02 DIAGNOSIS — G35 Multiple sclerosis: Secondary | ICD-10-CM | POA: Diagnosis not present

## 2022-11-06 DIAGNOSIS — Z1231 Encounter for screening mammogram for malignant neoplasm of breast: Secondary | ICD-10-CM

## 2022-12-15 ENCOUNTER — Other Ambulatory Visit: Payer: Self-pay | Admitting: Neurology

## 2022-12-15 DIAGNOSIS — G35 Multiple sclerosis: Secondary | ICD-10-CM

## 2022-12-23 ENCOUNTER — Telehealth: Payer: Medicaid Other | Admitting: Physician Assistant

## 2022-12-23 DIAGNOSIS — R0602 Shortness of breath: Secondary | ICD-10-CM | POA: Diagnosis not present

## 2022-12-23 DIAGNOSIS — B9689 Other specified bacterial agents as the cause of diseases classified elsewhere: Secondary | ICD-10-CM

## 2022-12-23 DIAGNOSIS — Z2089 Contact with and (suspected) exposure to other communicable diseases: Secondary | ICD-10-CM

## 2022-12-23 DIAGNOSIS — J208 Acute bronchitis due to other specified organisms: Secondary | ICD-10-CM | POA: Diagnosis not present

## 2022-12-23 MED ORDER — BENZONATATE 100 MG PO CAPS: 100.0000 mg | ORAL_CAPSULE | Freq: Three times a day (TID) | ORAL | 0 refills | Status: AC | PRN

## 2022-12-23 MED ORDER — ALBUTEROL SULFATE HFA 108 (90 BASE) MCG/ACT IN AERS: 2.0000 | INHALATION_SPRAY | Freq: Four times a day (QID) | RESPIRATORY_TRACT | 0 refills | Status: AC | PRN

## 2022-12-23 MED ORDER — AZITHROMYCIN 250 MG PO TABS: ORAL_TABLET | ORAL | 0 refills | Status: AC

## 2022-12-23 NOTE — Progress Notes (Signed)
Patient still has not responded to additional questions. Will close out encounter for now since end of shift, but will reopen if she responds overnight.

## 2022-12-23 NOTE — Progress Notes (Signed)
Message sent to patient requesting further input regarding current symptoms. Awaiting patient response.  

## 2022-12-23 NOTE — Progress Notes (Addendum)
E-Visit for Cough   We are sorry that you are not feeling well.  Here is how we plan to help!  Based on your presentation I believe you most likely have A cough due to bacteria.  When patients have  a productive cough with a change in color or increased sputum production, we are concerned about bacterial bronchitis.  If left untreated it can progress to pneumonia.  If your symptoms do not improve with your treatment plan it is important that you contact your provider.   I have prescribed Azithromyin 250 mg: two tablets now and then one tablet daily for 4 additonal days    In addition you may use A prescription cough medication called Tessalon Perles 100mg . You may take 1-2 capsules every 8 hours as needed for your cough.  I have also prescribed a rescue inhaler to use as directed to help relax airways.  From your responses in the eVisit questionnaire you describe inflammation in the upper respiratory tract which is causing a significant cough.  This is commonly called Bronchitis and has four common causes:   Allergies Viral Infections Acid Reflux Bacterial Infection Allergies, viruses and acid reflux are treated by controlling symptoms or eliminating the cause. An example might be a cough caused by taking certain blood pressure medications. You stop the cough by changing the medication. Another example might be a cough caused by acid reflux. Controlling the reflux helps control the cough.  USE OF BRONCHODILATOR ("RESCUE") INHALERS: There is a risk from using your bronchodilator too frequently.  The risk is that over-reliance on a medication which only relaxes the muscles surrounding the breathing tubes can reduce the effectiveness of medications prescribed to reduce swelling and congestion of the tubes themselves.  Although you feel brief relief from the bronchodilator inhaler, your asthma may actually be worsening with the tubes becoming more swollen and filled with mucus.  This can delay other  crucial treatments, such as oral steroid medications. If you need to use a bronchodilator inhaler daily, several times per day, you should discuss this with your provider.  There are probably better treatments that could be used to keep your asthma under control.     HOME CARE Only take medications as instructed by your medical team. Complete the entire course of an antibiotic. Drink plenty of fluids and get plenty of rest. Avoid close contacts especially the very young and the elderly Cover your mouth if you cough or cough into your sleeve. Always remember to wash your hands A steam or ultrasonic humidifier can help congestion.   GET HELP RIGHT AWAY IF: You develop worsening fever. You become short of breath You cough up blood. Your symptoms persist after you have completed your treatment plan MAKE SURE YOU  Understand these instructions. Will watch your condition. Will get help right away if you are not doing well or get worse.    Thank you for choosing an e-visit.  Your e-visit answers were reviewed by a board certified advanced clinical practitioner to complete your personal care plan. Depending upon the condition, your plan could have included both over the counter or prescription medications.  Please review your pharmacy choice. Make sure the pharmacy is open so you can pick up prescription now. If there is a problem, you may contact your provider through [employer] and have the prescription routed to another pharmacy.  Your safety is important to [place]. If you have drug allergies check your prescription carefully.   For the next 24  hours you can use MyChart to ask questions about today's visit, request a non-urgent call back, or ask for a work or school excuse. You will get an email in the next two days asking about your experience. I hope that your e-visit has been valuable and will speed your recovery.

## 2022-12-23 NOTE — Addendum Note (Signed)
Addended by: Waldon Merl on: 12/23/2022 07:21 PM   Modules accepted: Orders, Level of Service

## 2022-12-23 NOTE — Progress Notes (Signed)
I have spent 5 minutes in review of e-visit questionnaire, review and updating patient chart, medical decision making and response to patient.   Mia Milan Cody Jacklynn Dehaas, PA-C    

## 2022-12-24 ENCOUNTER — Ambulatory Visit
Admission: RE | Admit: 2022-12-24 | Discharge: 2022-12-24 | Disposition: A | Discharge status: Home / Self Care | Payer: Medicaid Other | Source: Ambulatory Visit | Attending: Primary Care | Admitting: Primary Care

## 2022-12-24 ENCOUNTER — Ambulatory Visit: Admission: RE | Admit: 2022-12-24 | Payer: Medicaid Other | Source: Ambulatory Visit

## 2022-12-24 DIAGNOSIS — Z1231 Encounter for screening mammogram for malignant neoplasm of breast: Secondary | ICD-10-CM | POA: Diagnosis not present

## 2022-12-25 ENCOUNTER — Encounter: Payer: Self-pay | Admitting: Nurse Practitioner

## 2022-12-25 DIAGNOSIS — R87610 Atypical squamous cells of undetermined significance on cytologic smear of cervix (ASC-US): Secondary | ICD-10-CM | POA: Diagnosis not present

## 2022-12-25 DIAGNOSIS — N879 Dysplasia of cervix uteri, unspecified: Secondary | ICD-10-CM | POA: Diagnosis not present

## 2022-12-25 NOTE — Telephone Encounter (Signed)
Are we ok set up nurse visit for HPV for patient.

## 2022-12-26 NOTE — Telephone Encounter (Signed)
We need to call GYN office (in [facility]) to make sure they are okay with her starting the Gardisil vaccine at her age.

## 2022-12-28 ENCOUNTER — Other Ambulatory Visit: Payer: Self-pay | Admitting: Primary Care

## 2022-12-28 DIAGNOSIS — R928 Other abnormal and inconclusive findings on diagnostic imaging of breast: Secondary | ICD-10-CM

## 2022-12-28 NOTE — Telephone Encounter (Signed)
Yes, okay to schedule for series.

## 2022-12-28 NOTE — Telephone Encounter (Signed)
Called and left message with GYN office that is being sent to Dr. Tresa Endo to see if patient is okay to start Gardasil due to Age.

## 2022-12-28 NOTE — Telephone Encounter (Signed)
Patients GYN office called back and stated it was okay to start the Gardasil vaccine series at her age.  Okay to proceed with [place]?

## 2022-12-29 ENCOUNTER — Ambulatory Visit
Admission: RE | Admit: 2022-12-29 | Discharge: 2022-12-29 | Disposition: A | Discharge status: Home / Self Care | Payer: Medicaid Other | Source: Ambulatory Visit | Attending: Neurology | Admitting: Neurology

## 2022-12-29 ENCOUNTER — Other Ambulatory Visit: Payer: Medicaid Other

## 2022-12-29 ENCOUNTER — Ambulatory Visit: Payer: Medicaid Other

## 2022-12-29 DIAGNOSIS — G35 Multiple sclerosis: Secondary | ICD-10-CM

## 2022-12-29 DIAGNOSIS — R9082 White matter disease, unspecified: Secondary | ICD-10-CM | POA: Diagnosis not present

## 2022-12-29 DIAGNOSIS — R29898 Other symptoms and signs involving the musculoskeletal system: Secondary | ICD-10-CM | POA: Diagnosis not present

## 2022-12-29 DIAGNOSIS — R531 Weakness: Secondary | ICD-10-CM | POA: Diagnosis not present

## 2022-12-31 ENCOUNTER — Inpatient Hospital Stay: Payer: Medicaid Other | Admitting: Nurse Practitioner

## 2022-12-31 ENCOUNTER — Ambulatory Visit (INDEPENDENT_AMBULATORY_CARE_PROVIDER_SITE_OTHER): Payer: Medicaid Other

## 2022-12-31 DIAGNOSIS — Z23 Encounter for immunization: Secondary | ICD-10-CM

## 2022-12-31 NOTE — Progress Notes (Signed)
Per orders of Mayra Reel, DPN AGNP-C, injection of firstHPV given by Lewanda Rife in left deltoid. Patient tolerated injection well. Patient will make appointment for 2 month.

## 2023-01-07 ENCOUNTER — Ambulatory Visit
Admission: RE | Admit: 2023-01-07 | Discharge: 2023-01-07 | Disposition: A | Discharge status: Home / Self Care | Payer: Medicaid Other | Source: Ambulatory Visit | Attending: Primary Care | Admitting: Primary Care

## 2023-01-07 ENCOUNTER — Telehealth: Payer: Self-pay | Admitting: Primary Care

## 2023-01-07 ENCOUNTER — Other Ambulatory Visit: Payer: Self-pay | Admitting: Primary Care

## 2023-01-07 DIAGNOSIS — N63 Unspecified lump in unspecified breast: Secondary | ICD-10-CM

## 2023-01-07 DIAGNOSIS — N6325 Unspecified lump in the left breast, overlapping quadrants: Secondary | ICD-10-CM | POA: Diagnosis not present

## 2023-01-07 DIAGNOSIS — R928 Other abnormal and inconclusive findings on diagnostic imaging of breast: Secondary | ICD-10-CM | POA: Insufficient documentation

## 2023-01-07 DIAGNOSIS — R92343 Mammographic extreme density, bilateral breasts: Secondary | ICD-10-CM | POA: Diagnosis not present

## 2023-01-07 NOTE — Telephone Encounter (Signed)
 Called and advised breast center of approval of orders needed for breast biopsy.

## 2023-01-07 NOTE — Telephone Encounter (Signed)
 Please call the breast center and notify them that I approve all breast orders needed.

## 2023-01-07 NOTE — Telephone Encounter (Signed)
 Copied from CRM [id number]. Topic: General - Other >> Jan 07, 2023 10:49 AM Angel Reilly wrote: Reason for CRM: Angel Reilly from [facility] breast center at [facility] called in regarding patient being seen in office today, stated patient has a few concerning mass in left breast and they would like orders signed off on or at least verbal consent for a biopsy first thing in the morning but Angel Reilly is out of office, asked if covering provider could get this completed for them Call back at [phone]

## 2023-01-08 ENCOUNTER — Ambulatory Visit
Admission: RE | Admit: 2023-01-08 | Discharge: 2023-01-08 | Disposition: A | Discharge status: Home / Self Care | Payer: Medicaid Other | Source: Ambulatory Visit | Attending: Primary Care | Admitting: Primary Care

## 2023-01-08 ENCOUNTER — Ambulatory Visit
Admission: RE | Admit: 2023-01-08 | Discharge: 2023-01-08 | Disposition: A | Discharge status: Home / Self Care | Payer: Medicaid Other | Source: Ambulatory Visit | Attending: Primary Care

## 2023-01-08 DIAGNOSIS — R928 Other abnormal and inconclusive findings on diagnostic imaging of breast: Secondary | ICD-10-CM

## 2023-01-08 DIAGNOSIS — N63 Unspecified lump in unspecified breast: Secondary | ICD-10-CM

## 2023-01-08 DIAGNOSIS — R896 Abnormal cytological findings in specimens from other organs, systems and tissues: Secondary | ICD-10-CM | POA: Insufficient documentation

## 2023-01-08 DIAGNOSIS — C50812 Malignant neoplasm of overlapping sites of left female breast: Secondary | ICD-10-CM | POA: Insufficient documentation

## 2023-01-08 DIAGNOSIS — Z170 Estrogen receptor positive status [ER+]: Secondary | ICD-10-CM | POA: Diagnosis not present

## 2023-01-08 DIAGNOSIS — R599 Enlarged lymph nodes, unspecified: Secondary | ICD-10-CM | POA: Diagnosis not present

## 2023-01-08 DIAGNOSIS — N6325 Unspecified lump in the left breast, overlapping quadrants: Secondary | ICD-10-CM | POA: Diagnosis not present

## 2023-01-08 DIAGNOSIS — Z17411 Hormone receptor positive with human epidermal growth factor receptor 2 negative status: Secondary | ICD-10-CM | POA: Diagnosis not present

## 2023-01-08 HISTORY — PX: BREAST BIOPSY: SHX20

## 2023-01-08 MED ORDER — LIDOCAINE 1 % OPTIME INJ - NO CHARGE
2.0000 mL | Freq: Once | INTRAMUSCULAR | Status: AC
  Administered 2023-01-08: 2 mL
  Filled 2023-01-08: qty 2

## 2023-01-08 MED ORDER — LIDOCAINE-EPINEPHRINE 2 %-1:100000 IJ SOLN
8.0000 mL | Freq: Once | INTRAMUSCULAR | Status: AC
  Administered 2023-01-08: 8 mL
  Filled 2023-01-08: qty 8.5

## 2023-01-12 ENCOUNTER — Encounter: Payer: Self-pay | Admitting: *Deleted

## 2023-01-12 LAB — SURGICAL PATHOLOGY

## 2023-01-12 NOTE — Progress Notes (Signed)
 Received referral for newly diagnosed breast cancer from [facility].  Navigation initiated.  Angel Reilly would like to be seen at [facility].  Path report, mammo report and demographics fax[facility] at [phone] per her request.

## 2023-01-19 DIAGNOSIS — N6322 Unspecified lump in the left breast, upper inner quadrant: Secondary | ICD-10-CM | POA: Diagnosis not present

## 2023-01-19 DIAGNOSIS — N6321 Unspecified lump in the left breast, upper outer quadrant: Secondary | ICD-10-CM | POA: Diagnosis not present

## 2023-01-29 DIAGNOSIS — C50912 Malignant neoplasm of unspecified site of left female breast: Secondary | ICD-10-CM | POA: Diagnosis not present

## 2023-02-10 DIAGNOSIS — C50912 Malignant neoplasm of unspecified site of left female breast: Secondary | ICD-10-CM | POA: Diagnosis not present

## 2023-02-10 DIAGNOSIS — C50812 Malignant neoplasm of overlapping sites of left female breast: Secondary | ICD-10-CM | POA: Diagnosis not present

## 2023-02-10 DIAGNOSIS — G35 Multiple sclerosis: Secondary | ICD-10-CM | POA: Diagnosis not present

## 2023-02-10 DIAGNOSIS — C50919 Malignant neoplasm of unspecified site of unspecified female breast: Secondary | ICD-10-CM | POA: Diagnosis not present

## 2023-02-10 DIAGNOSIS — Z6841 Body Mass Index (BMI) 40.0 and over, adult: Secondary | ICD-10-CM | POA: Diagnosis not present

## 2023-02-12 DIAGNOSIS — N879 Dysplasia of cervix uteri, unspecified: Secondary | ICD-10-CM | POA: Diagnosis not present

## 2023-02-18 DIAGNOSIS — C50912 Malignant neoplasm of unspecified site of left female breast: Secondary | ICD-10-CM | POA: Diagnosis not present

## 2023-02-18 DIAGNOSIS — G35 Multiple sclerosis: Secondary | ICD-10-CM | POA: Diagnosis not present

## 2023-03-02 DIAGNOSIS — C50812 Malignant neoplasm of overlapping sites of left female breast: Secondary | ICD-10-CM | POA: Diagnosis not present

## 2023-03-02 DIAGNOSIS — Z421 Encounter for breast reconstruction following mastectomy: Secondary | ICD-10-CM | POA: Diagnosis not present

## 2023-03-03 ENCOUNTER — Ambulatory Visit: Payer: Medicaid Other

## 2023-03-05 DIAGNOSIS — C50912 Malignant neoplasm of unspecified site of left female breast: Secondary | ICD-10-CM | POA: Diagnosis not present

## 2023-03-05 DIAGNOSIS — C8594 Non-Hodgkin lymphoma, unspecified, lymph nodes of axilla and upper limb: Secondary | ICD-10-CM | POA: Diagnosis not present

## 2023-03-11 ENCOUNTER — Ambulatory Visit (INDEPENDENT_AMBULATORY_CARE_PROVIDER_SITE_OTHER)

## 2023-03-11 DIAGNOSIS — Z23 Encounter for immunization: Secondary | ICD-10-CM

## 2023-03-11 NOTE — Progress Notes (Signed)
 Per orders of Mordecai Maes, NP , injection of  HPV #2  given by Donnamarie Poag in left deltoid. Patient tolerated injection well. Patient will make appointment for 4 month for last HPV. This will be 6 months from #1

## 2023-03-18 DIAGNOSIS — C50912 Malignant neoplasm of unspecified site of left female breast: Secondary | ICD-10-CM | POA: Diagnosis not present

## 2023-03-18 DIAGNOSIS — C50412 Malignant neoplasm of upper-outer quadrant of left female breast: Secondary | ICD-10-CM | POA: Diagnosis not present

## 2023-03-18 DIAGNOSIS — Z6841 Body Mass Index (BMI) 40.0 and over, adult: Secondary | ICD-10-CM | POA: Diagnosis not present

## 2023-03-18 DIAGNOSIS — G8918 Other acute postprocedural pain: Secondary | ICD-10-CM | POA: Diagnosis not present

## 2023-03-18 DIAGNOSIS — N6001 Solitary cyst of right breast: Secondary | ICD-10-CM | POA: Diagnosis not present

## 2023-03-18 DIAGNOSIS — N6011 Diffuse cystic mastopathy of right breast: Secondary | ICD-10-CM | POA: Diagnosis not present

## 2023-03-18 DIAGNOSIS — E66813 Obesity, class 3: Secondary | ICD-10-CM | POA: Diagnosis not present

## 2023-03-18 DIAGNOSIS — N6031 Fibrosclerosis of right breast: Secondary | ICD-10-CM | POA: Diagnosis not present

## 2023-03-18 DIAGNOSIS — Z79899 Other long term (current) drug therapy: Secondary | ICD-10-CM | POA: Diagnosis not present

## 2023-03-18 DIAGNOSIS — N6021 Fibroadenosis of right breast: Secondary | ICD-10-CM | POA: Diagnosis not present

## 2023-03-18 DIAGNOSIS — G35 Multiple sclerosis: Secondary | ICD-10-CM | POA: Diagnosis not present

## 2023-03-18 DIAGNOSIS — Z170 Estrogen receptor positive status [ER+]: Secondary | ICD-10-CM | POA: Diagnosis not present

## 2023-03-18 NOTE — H&P (Signed)
 Healthcare, [place] [address] [place],  [place] [address]  Healthcare, [facility] [address] [place] [address]  Subjective:   Angel Reilly is a 48 y.o. 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   Patient has not noted a change on breast exam. She denies any palpable findings, skin redness, edema or dimpling, and nipple discharge or retraction.   She is not of Ashkenazi Jewish descent. She has family history of breast cancer with her maternal aunt having had breast cancer in her 42's. No family history of melanoma or ovarian cancer noted but a paternal aunt did have pancreatic cancer in her 8's..   Genetic Report Results: negative report   [employer] Breast STAT Panel with reflex testing to the Common Hereditary Cancer Panel was negative. To the best of the testing ability, This patient is not a carrier of a clinically actionable mutation in the APC,  ATM, AXIN2, BAP1, BARD1, BMPR1A,  BRCA1, BRCA2, BRIP1, CDH1, CDK4, CDKN2A, CHEK2, CTNNA1, DICER1, EPCAM, FH, GREM1, HOXB13, KIT, MBD4, MEN1, MLH1, MSH2,  MSH3, MSH6, MUTYH, NF1, NTHL1, PALB2, PDGFRA, PMS2,  POLD1, POLE, PTEN, RAD51C, RAD51D, SDHA, SDHB, SDHC, SDHD, SMAD4, SMARCA4, STK11, TP53, TSC1, TSC2, and VHL.  Imaging:  EXAM: Interpretation of outside breast imaging requested by  Annitta Annitta Mix mi MD Dinome MD .   INDICATION: Interpretation of outside breast imaging requested to determine second opinion.   COMPARISON: Screening mammogram dated 02/05/2021   TECHNIQUE: Screening mammogram dated 12/24/2022 Diagnostic left breast mammogram dated 01/07/2023 Diagnostic left breast  ultrasound dated 01/07/2023   FINDINGS:   Screening mammogram 12/24/2022:   Right: No mammographic evidence of malignancy.  No suspicious mass, architectural distortion, or calcifications.   Left: In the upper left breast posterior depth there is a 1.0 x 0.9 cm mass with indistinct margins and associated architectural distortion (CC slice 51/87 and MLO slice 54/84).    Diagnostic left breast mammogram 01/07/2023:   Spot compression tomosynthesis oh CC and MLO views of the left breast were obtained.  In addition, full-field true lateral tomosynthesis imaging was performed.  1.0 x 0.9 cm mass with indistinct margins and associated architectural distortion at the 12:00 axis, posterior depth approximately 8.4 cm from the nipple (ML slice 55/99; CC slice 55/97; MLO slice 55/91).  There is an additional 0.8 cm asymmetry middle depth, approximately 7.0 cm from the nipple (ML slice 55/99).   Diagnostic left breast ultrasound 01/07/2023:   Targeted grayscale and color Doppler ultrasound of the left breast at the 12:00 axis was performed.  There are 2 hypoechoic masses in the upper left breast, spanning approximately 5.4 cm (series 1 image 2000).   There is a 1.0 x 1.0 x 0.9 cm hypoechoic round mass with indistinct margins located at the 12:00 axis approximately 12 cm from the nipple.  This mass was biopsied under ultrasound guidance on 01/08/2023 with the postprocedure mammogram demonstrating the biopsy clip in appropriate position.   There is an additional 0.6 x 0.6 x 0.6 cm round mass with indistinct margins located at the 12:00 axis approximately 5 cm from the nipple.  This mass was also biopsied under ultrasound guidance on 01/08/2023 with the postprocedure mammogram demonstrating the  biopsy clip in appropriate position.   Limited ultrasound of the left axilla was performed demonstrating a left axillary lymph node with lobulated cortical thickening measuring up to 0.3 cm.  This lymph node was biopsied under  ultrasound guidance on 01/08/2023, demonstrating atypical lymphoid proliferation. Hydromark clip was reportedly placed, but not visualized on post-procedure mammogram.    IMPRESSION: Two hypoechoic masses with indistinct margins at the 12:00 axis of the left breast in the posterior depth, respectively.  These masses span a distance of approximately 5.4 cm and were biopsied under ultrasound guidance on 01/08/2023 demonstrating multifocal invasive ductal carcinoma.  BI-RADS 6. Left axillary lymph node with lobulated cortical thickening.  This lymph node was biopsied on 01/08/2023, demonstrating atypical lymphoid proliferation. Excision was recommended by pathology. Hydromark clip was reportedly placed, but not visualized on post-procedure mammogram.    Pathology:  DIAGNOSIS  A.  Outside case, [id number], [place] [facility], [place], [place].  Date of procedure 01/08/23:    A.  Left breast, 12:00, 12 cm from nipple, ribbon clip, needle core biopsy:    Invasive adenocarcinoma of the breast (5 mm on core). Histologic type: ductal Provisional Nottingham combined histologic grade: 1 of 3 Tubule formation score: 2 Nuclear pleomorphism score: 2 Mitotic rate score:  1   In-situ carcinoma: absent   Breast cancer biomarker studies (per outside report, slides not provided for review): Estrogen receptor:   positive (90% staining, moderate-strong intensity) Progesterone receptor:  positive (95% staining, moderate-strong intensity) Her2/neu immunohistochemistry:  negative (score = 1+)     B.  Left breast, 12:00, 5 cm from nipple, venus clip, needle core biopsy:    Invasive adenocarcinoma of the breast (3 mm on core), morphologically similar to specimen A, above.   Breast cancer biomarker studies (per outside report, slides not provided for review): Estrogen receptor:   positive (90% staining, strong intensity) Progesterone receptor:  positive (30% staining, strong intensity) Her2/neu  immunohistochemistry:  negative (score = 1+)   C.  Lymph node, left axilla, hydromark butterfly clip, needle core biopsy:    Negative for metastatic carcinoma.  See comment.   Comment: The lymph node biopsies are negative for metastatic carcinoma.  However, these biopsies are being referred to our hematopathology division for further review, and an addendum will follow with additional review.     Gynecologic History: Menarche: 89 G4P1 Age of 1st live birth: 36 Breastfed: yes 3 years Birth control: OCP Menopause:- still get her periods, LMP 01/29/23 HRT: none  The following portions of the patient's history were reviewed and updated as appropriate. Past Medical History:  has a past medical history of Depression and Multiple sclerosis (CMS/HHS-HCC) (05/18/2019). Past Surgical History:  has a past surgical history that includes Carpal tunnel release (2013); repair blepharoptosis w/levator muscle resection external approach (Left, 07/23/2020); and blepharoplasty upper eyelid (Bilateral, 07/23/2020). Family History: family history includes Cancer in her maternal aunt and paternal aunt; High blood pressure (Hypertension) in her father and mother. Social History:  reports that she has never smoked. She has never used smokeless tobacco. She reports that she does not currently use alcohol. She reports that she does not use drugs. Current Medications: has a current medication list which includes the following prescription(s): cholecalciferol, modafinil , ocrevus, and tizanidine. Allergies: has No Known Allergies.  Review of Systems Complete Review of System was done. All pertinent positives and negatives as documented in the HPI. All other systems negative.   Objective:   Vitals   Vitals:   02/10/23 9092  BP: 130/84  Pulse: 69  Resp: 18  Temp: 37.1 C (98.8 F)  BMI: Body mass index is 44.92 kg/m.    General appearance:  alert, appears stated age, and cooperative  Abdomen: soft,  non-tender; no palpable masses  Psych Appropriate.  Neurological Intact and nonfocal.  Lymph Nodes:  Cervical, supraclavicular, and axillary nodes normal.  Bilateral Breasts Patient examined in both upright and supine positions. Breasts symmetrical. No skin redness, edema, thickness, peau d'orange. No sites of skin dimpling or retraction.   Right Breast:  normal without suspicious palpable masses. No nipple discharge or retraction.   Left Breast:  normal without suspicious palpable masses. No nipple discharge or retraction.    Assessment:   This is a 48 y.o. female with a multifocal clinical Stage I left breast cancer: 12 o'clock 5 cm FN: T: 1b (6 mm)  N: 0  M: 0  Grade: 1  ER: 90%, 3+  PR: 30%, 3+ HER2: 1+ 12 o'clock 12 cm FN: T: 1c (10 mm)  N: 0  M: 0  Grade 1: ER: 90%, 3+  PR: 95%, 3+  HER2: 1+  Acute, new diagnosis: Multifocal, Stage I IDC left breast 12 o'clock    Chronic diagnoses: Multiple sclerosis (improved); depression  Based on the biology and stage of her disease, we have recommended proceeding to surgery.   She is a reasonable candidate for breast conservation therapy, which includes a lumpectomy with negative margins followed typically by radiation. We discussed that a lumpectomy is not inferior to a mastectomy for breast cancer treatment and survival and that a mastectomy is not superior. We discussed that often a mastectomy will obviate the need for radiation but that some patients will still be recommended for post-mastectomy radiation based on stage of disease. The patient is interested in proceeding with a mastectomy.   Unfortunately, given her current BMI, she is not a candidate for immediate reconstruction. However, she is motivated for delayed reconstruction so will be referred to plastic surgery for a discussion about goals for weight loss and planned delayed reconstruction. She is also interested in contralateral mastectomy. The option of bilateral mastectomy upfront or  unilateral mastectomy now with contralateral mastectomy done at the time of delayed reconstruction will be discussed with plastic surgery to optimize aesthetics. Since she will undergo reconstruction at a later date, I will not perform an aesthetic flat closure and will leave some residual breast skin per patient's request.  We discussed the need for surgical staging of her axilla. We discussed preoperative lymphoscintography for sentinel node mapping. Her node is currently being reviewed by hematology pathology at [facility].  The operative procedure, risks, alternatives and expected recovery were discussed. All of her questions were answered.   We also discussed that radiation therapy is recommended after surgery to complete breast conservation therapy, and that she will be recommended for endocrine therapy for 5 years given the estrogen positivity of her breast cancer. We discussed that based on the biology of her disease that chemotherapy may not be needed but that this decision will be made once surgical pathology is available. Additional molecular testing with Oncotype DX or Mammaprint may also be recommended to assist with adjuvant systemic therapy recommendations.   Of the 60 minutes spent on this patient's care, over half was spent in face to face consultation discussing her diagnosis, recommended work-up and  treatment options.   High complexity decision making was involved given the need to review imaging, pathology, and discuss the various treatment options  for her newly diagnosed breast cancer.   Specifically, I personally reviewed and performed the following:  [x]  Mammogram images/report [x]  Ultrasound images/report []  MRI images/report [x]  Pathology results [x]  Physicians' notes []  Consultation with other care providers [x]  Preoperative counseling []  Ordering tests  In addition, I personally and independently reviewed the patient's images and agree with the radiologist's  interpretation of two masses left breast at 12 o'clock.  All of her questions were answered, and she wishes to proceed as discussed.  Plan:   []  Schedule bilateral breast MRI []  Schedule additional diagnostic work-up [x]  Referrals made: Plastic surgery; [facility] weight loss []  Follow-up outstanding results:  [x]  Schedule Surgery: Bilateral simple mastectomy with left axillary SLNB  [] Preoperative image localization with wire/SAVI under US /mammogram guidance [x] Preoperative radiolabeled technetium for sentinel node mapping  Attestation Statement:   I personally performed the service. (TP)  MAGGIE LEE DINOME, MD

## 2023-03-31 ENCOUNTER — Telehealth: Admitting: Physician Assistant

## 2023-03-31 DIAGNOSIS — C50919 Malignant neoplasm of unspecified site of unspecified female breast: Secondary | ICD-10-CM | POA: Diagnosis not present

## 2023-03-31 DIAGNOSIS — R600 Localized edema: Secondary | ICD-10-CM | POA: Diagnosis not present

## 2023-03-31 MED ORDER — FUROSEMIDE 20 MG PO TABS: ORAL_TABLET | ORAL | 0 refills | Status: AC

## 2023-03-31 NOTE — Progress Notes (Addendum)
 Virtual Visit Consent   Angel Reilly, you are scheduled for a virtual visit with a [facility] provider today. Just as with appointments in the office, your consent must be obtained to participate. Your consent will be active for this visit and any virtual visit you may have with one of our providers in the next 365 days. If you have a MyChart account, a copy of this consent can be sent to you electronically.  As this is a virtual visit, video technology does not allow for your provider to perform a traditional examination. This may limit your provider's ability to fully assess your condition. If your provider identifies any concerns that need to be evaluated in person or the need to arrange testing (such as labs, EKG, etc.), we will make arrangements to do so. Although advances in technology are sophisticated, we cannot ensure that it will always work on either your end or our end. If the connection with a video visit is poor, the visit may have to be switched to a telephone visit. With either a video or telephone visit, we are not always able to ensure that we have a secure connection.  By engaging in this virtual visit, you consent to the provision of healthcare and authorize for your insurance to be billed (if applicable) for the services provided during this visit. Depending on your insurance coverage, you may receive a charge related to this service.  I need to obtain your verbal consent now. Are you willing to proceed with your visit today? Angel Reilly has provided verbal consent on 03/31/2023 for a virtual visit (video or telephone). Gilberto Better, [place]  Date: 03/31/2023 5:43 PM   Virtual Visit via Video Note   I, [name], connected with  Angel Reilly  ([id number], April 05, 1975) on 03/31/23 at  5:15 PM EDT by a video-enabled telemedicine application and verified that I am speaking with the correct person using two identifiers.  Location: Patient: Virtual Visit Location  Patient: Home Provider: Virtual Visit Location Provider: Home Office   I discussed the limitations of evaluation and management by telemedicine and the availability of in person appointments. The patient expressed understanding and agreed to proceed.    History of Present Illness: Angel Reilly is a 48 y.o. who identifies as a female who was assigned female at birth, and is being seen today for feet edema.  HPI: 48 y/o F presents via telehealth video visit for c/o b/l feet edema. Pt states breast surgery last week and was followed by her Oncologist earlier today and states edema not from surgery. Upper body without any fluid retention. Denies lower leg edema. She has been using compression stockings and compression boots with minimal relief. She denies kidney disease.     Problems:  Patient Active Problem List   Diagnosis Date Noted   Conjunctivitis 10/29/2022   Prediabetes 10/29/2022   GAD (generalized anxiety disorder) 11/05/2021   Morbid obesity with BMI of 40.0-44.9, adult (HCC) 02/23/2020   Multiple sclerosis (HCC) 07/03/2019   High risk medication use 07/03/2019   Diplopia 05/24/2019   Balance disorder 05/24/2019   Low vitamin D level 05/24/2019   Hypersomnia 05/24/2019   Anxiety and depression 06/07/2017   Atypical squamous cells of undetermined significance (ASC-US) on cervical Pap smear 03/09/2014   Carpal tunnel syndrome, unspecified upper limb 04/28/2010    Allergies: No Known Allergies Medications:  Current Outpatient Medications:    furosemide (LASIX) 20 MG tablet, Take 1 tablet by mouth every other  day., Disp: 15 tablet, Rfl: 0   albuterol (VENTOLIN HFA) 108 (90 Base) MCG/ACT inhaler, Inhale 2 puffs into the lungs every 6 (six) hours as needed for wheezing or shortness of breath., Disp: 8 g, Rfl: 0   benzonatate (TESSALON) 100 MG capsule, Take 1 capsule (100 mg total) by mouth 3 (three) times daily as needed for cough., Disp: 30 capsule, Rfl: 0   busPIRone (BUSPAR)  10 MG tablet, Take 1 tablet (10 mg total) by mouth 2 (two) times daily. For anxiety, Disp: 180 tablet, Rfl: 3   modafinil (PROVIGIL) 200 MG tablet, Take by mouth., Disp: , Rfl:    Multiple Vitamins-Minerals (MULTIVITAMIN WITH MINERALS) tablet, Take 1 tablet by mouth daily., Disp: , Rfl:    ocrelizumab (OCREVUS) 300 MG/10ML injection, Inject 600 mg into the vein every 6 (six) months. GNA Intraufsion., Disp: , Rfl:    propranolol (INDERAL) 10 MG tablet, Take by mouth., Disp: , Rfl:    tiZANidine (ZANAFLEX) 2 MG tablet, Take 2 mg by mouth 2 (two) times daily as needed., Disp: , Rfl:    topiramate (TOPAMAX) 25 MG tablet, , Disp: , Rfl:    trimethoprim-polymyxin b (POLYTRIM) ophthalmic solution, Place 1 drop into both eyes every 6 (six) hours., Disp: 10 mL, Rfl: 0   Vitamin D, Ergocalciferol, (DRISDOL) 1.25 MG (50000 UNIT) CAPS capsule, Take 1 capsule by mouth once weekly for 12 weeks., Disp: 12 capsule, Rfl: 0  Observations/Objective: Patient is well-developed, well-nourished in no acute distress.  Resting comfortably  at home.  Head is normocephalic, atraumatic.  No labored breathing.  Speech is clear and coherent with logical content.  Patient is alert and oriented at baseline.    Assessment and Plan: 1. Edema of both feet (Primary) - furosemide (LASIX) 20 MG tablet; Take 1 tablet by mouth every other day.  Dispense: 15 tablet; Refill: 0  Take medicine as prescribed. Low salt diet ie frozen foods or canned foods Continue with compression stockings and boots Continue to check for worsening symptoms. Follow up with PCP as scheduled on April 9th or go to Urgent Care clinic earlier for worsening feet swelling. Kidney function: GFR: 79.70 and Creatinine 0.88 on 10/29/22  Pt verbalized understanding and in agreement.    Follow Up Instructions: I discussed the assessment and treatment plan with the patient. The patient was provided an opportunity to ask questions and all were answered. The  patient agreed with the plan and demonstrated an understanding of the instructions.  A copy of instructions were sent to the patient via MyChart unless otherwise noted below.   Patient has requested to receive PHI (AVS, Work Notes, etc) pertaining to this video visit through e-mail as they are currently without active MyChart. They have voiced understand that email is not considered secure and their health information could be viewed by someone other than the patient.   The patient was advised to call back or seek an in-person evaluation if the symptoms worsen or if the condition fails to improve as anticipated.    Gilberto Better, PA-C

## 2023-03-31 NOTE — Patient Instructions (Signed)
 Angel Reilly, thank you for joining Gilberto Better, PA-C for today's virtual visit.  While this provider is not your primary care provider (PCP), if your PCP is located in our provider database this encounter information will be shared with them immediately following your visit.   A [facility] MyChart account gives you access to today's visit and all your visits, tests, and labs performed at [facility] " click here if you don't have a [facility] MyChart account or go to [email or web address]  Consent: (Patient) Angel Reilly provided verbal consent for this virtual visit at the beginning of the encounter.  Current Medications:  Current Outpatient Medications:    furosemide (LASIX) 20 MG tablet, Take 1 tablet by mouth every other day., Disp: 15 tablet, Rfl: 0   albuterol (VENTOLIN HFA) 108 (90 Base) MCG/ACT inhaler, Inhale 2 puffs into the lungs every 6 (six) hours as needed for wheezing or shortness of breath., Disp: 8 g, Rfl: 0   benzonatate (TESSALON) 100 MG capsule, Take 1 capsule (100 mg total) by mouth 3 (three) times daily as needed for cough., Disp: 30 capsule, Rfl: 0   busPIRone (BUSPAR) 10 MG tablet, Take 1 tablet (10 mg total) by mouth 2 (two) times daily. For anxiety, Disp: 180 tablet, Rfl: 3   modafinil (PROVIGIL) 200 MG tablet, Take by mouth., Disp: , Rfl:    Multiple Vitamins-Minerals (MULTIVITAMIN WITH MINERALS) tablet, Take 1 tablet by mouth daily., Disp: , Rfl:    ocrelizumab (OCREVUS) 300 MG/10ML injection, Inject 600 mg into the vein every 6 (six) months. GNA Intraufsion., Disp: , Rfl:    propranolol (INDERAL) 10 MG tablet, Take by mouth., Disp: , Rfl:    tiZANidine (ZANAFLEX) 2 MG tablet, Take 2 mg by mouth 2 (two) times daily as needed., Disp: , Rfl:    topiramate (TOPAMAX) 25 MG tablet, , Disp: , Rfl:    trimethoprim-polymyxin b (POLYTRIM) ophthalmic solution, Place 1 drop into both eyes every 6 (six) hours., Disp: 10 mL, Rfl: 0   Vitamin D,  Ergocalciferol, (DRISDOL) 1.25 MG (50000 UNIT) CAPS capsule, Take 1 capsule by mouth once weekly for 12 weeks., Disp: 12 capsule, Rfl: 0   Medications ordered in this encounter:  Meds ordered this encounter  Medications   furosemide (LASIX) 20 MG tablet    Sig: Take 1 tablet by mouth every other day.    Dispense:  15 tablet    Refill:  0    Supervising Provider:   Merrilee Jansky [[id number]]     *If you need refills on other medications prior to your next appointment, please contact your pharmacy*  Follow-Up: Call back or seek an in-person evaluation if the symptoms worsen or if the condition fails to improve as anticipated.  [facility] [phone]  Other Instructions Take medicine as prescribed. Low salt diet ie frozen foods or canned foods Continue with compression stockings and boots Continue to check for worsening symptoms. Follow up with PCP as scheduled on April 9th or go to Urgent Care clinic earlier for worsening feet swelling.   If you have been instructed to have an in-person evaluation today at a local Urgent Care facility, please use the link below. It will take you to a list of all of our available [facility], including address, phone number and hours of operation. Please do not delay care.  [facility]  If you or a family member do not have a primary care provider, use  the link below to schedule a visit and establish care. When you choose a [facility] primary care physician or advanced practice provider, you gain a long-term partner in health. Find a Primary Care Provider  Learn more about [facility]'s in-office and virtual care options: [facility] - Get Care Now

## 2023-04-05 DIAGNOSIS — C50912 Malignant neoplasm of unspecified site of left female breast: Secondary | ICD-10-CM | POA: Diagnosis not present

## 2023-04-05 DIAGNOSIS — Z170 Estrogen receptor positive status [ER+]: Secondary | ICD-10-CM | POA: Diagnosis not present

## 2023-04-13 DIAGNOSIS — C50919 Malignant neoplasm of unspecified site of unspecified female breast: Secondary | ICD-10-CM | POA: Diagnosis not present

## 2023-04-13 DIAGNOSIS — C50812 Malignant neoplasm of overlapping sites of left female breast: Secondary | ICD-10-CM | POA: Diagnosis not present

## 2023-04-20 DIAGNOSIS — C50919 Malignant neoplasm of unspecified site of unspecified female breast: Secondary | ICD-10-CM | POA: Diagnosis not present

## 2023-04-20 DIAGNOSIS — C8330 Diffuse large B-cell lymphoma, unspecified site: Secondary | ICD-10-CM | POA: Diagnosis not present

## 2023-06-28 DIAGNOSIS — G35 Multiple sclerosis: Secondary | ICD-10-CM | POA: Diagnosis not present

## 2023-07-13 DIAGNOSIS — C8330 Diffuse large B-cell lymphoma, unspecified site: Secondary | ICD-10-CM | POA: Diagnosis not present

## 2023-07-13 DIAGNOSIS — Z9013 Acquired absence of bilateral breasts and nipples: Secondary | ICD-10-CM | POA: Diagnosis not present

## 2023-07-13 NOTE — Progress Notes (Signed)
 FOLLOW UP PATIENT EVALUATION   07/13/2023   NAME: Angel Reilly   MRN: [id number] DOB: 1975-08-08 AGE: 48 y.o.                                               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  History of Present Illness The patient, with a history of multiple sclerosis and breast cancer, presents with concerns about a recent diagnosis of lymphoma. The patient's journey began with a routine mammogram in December 2024, which revealed an abnormality. Initially attributing the abnormality to weight gain related to multiple sclerosis, the patient was surprised when a biopsy in January revealed breast cancer. A left axillary node was also biopsied. This node did not show involvement by breast cancer but was consistent with DLBCL. The differential diagnosis included nodular lymphocyte predominant B cell lymphoma. The patient underwent a mastectomy March 18 2023. There was no evidence of lymphoma in this surgical specimen. Of note, in between this biopsy and excision, she did not receive her ocrelizumab.  The patient also has a history of multiple sclerosis, which has caused significant physical and cognitive symptoms. The patient reports difficulty walking, fatigue, and cognitive issues, including memory problems. The patient has been managing these symptoms with ocrelizumab, administered twice a year. However, the patient has stopped this treatment due to the recent cancer diagnosis and the potential link between ocrelizumab and breast cancer. The patient is currently considering future treatment options for multiple sclerosis.    INTERVAL HISTORY Patient states that she has been trying to adjust to her new life now after having her  mastectomy. She has been doing well after this. She is pending weight loss so she can get reconstruction- she wants to lose weight thought working out, watching what she eating. She has been doing challenges with a womens group. She has been trying to snack less - and has support.   She states that before the diagnosis, she has been feeling really tired and that was her mainly symptom. The whole year she states in 2024 she felt fatigued. Even now, she feels she can go a couple hours awake before feeling like she needs to go to sleep. She can push herself to stay up sometimes but always feels she needs to sleep.  Denies any weakness in her muscles or joints. She hasn't been taking her MS medication right now, so sometimes she feels spasms for this. She has been off of this medication since April (gets it q6 months), so pending the plan with her DLBCL.   Denies any fevers, chills, night sweats. No recent infections or illnesses. No recent sick contacts. No recent travel.  Denies any bleeding or bruises, changes in vision or headaches.  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    Skin: Denies rash and pruritis.  Eyes: Denies any changes in vision.  ENT: Denies difficulty swallowing, sore throat and change in voice.  Heme / Lymph: Denies bruising, bleeding and LN enlargement.  Cardiac: Denies chest pain and palpitations.  Respiratory: Denies cough, sputum production, and shortness of breath.  GU: Denies bleeding, dysuria, and difficulty with urination.  Gastrointestinal: Denies abdominal pain, constipation, diarrhea, and change in bowel habits.  Musculoskeletal: Denies back pain, arthralgias, and arthritis. Healing from mastectomy. No specific concerns.   Neurologic: Denies numbness,  tingling and weakness.   ALLERGIES: No Known Allergies  MEDICATIONS: Current Outpatient Medications  Medication Sig Dispense Refill  . ergocalciferol , vitamin D2, 1,250 mcg (50,000 unit) capsule Take 50,000 Units by mouth once a week    . tamoxifen (NOLVADEX) 20 MG tablet Take 1 tablet (20 mg total) by mouth once daily 30 tablet 4  . tiZANidine (ZANAFLEX) 4 MG tablet Take 1 tablet (4 mg total) by mouth 3 (three) times daily as needed 270 tablet 3  . OCREVUS 30 mg/mL injection Infuse 600mg  ( ) intravenously every 6 months (Patient not taking: Reported on 04/20/2023) 20 mL 1   No current facility-administered medications for this visit.    PAST MEDICAL HISTORY: Past Medical History:  Diagnosis Date  . Depression   . Multiple sclerosis (CMS/HHS-HCC) 05/18/2019    PAST SURGICAL HISTORY: Past Surgical History:  Procedure Laterality Date  . CARPAL TUNNEL RELEASE  2013  . REPAIR BLEPHAROPTOSIS W/LEVATOR MUSCLE RESECTION EXTERNAL APPROACH Left 07/23/2020   Procedure: REPAIR OF BLEPHAROPTOSIS; (TARSO) LEVATOR RESECTION OR ADVANCEMENT, EXTERNAL APPROACH;  Surgeon: Harriette Mliss Caldron, MD;  [facility];  Service: Ophthalmology;  Laterality: Left;  . BLEPHAROPLASTY UPPER EYELID Bilateral 07/23/2020   Procedure: BLEPHAROPLASTY, UPPER EYELID; WITH EXCESSIVE SKIN WEIGHTING DOWN LID;  Surgeon: Harriette Mliss Caldron, MD;  [facility];  Service: Ophthalmology;  Laterality: Bilateral;  . MASTECTOMY BILATERAL SIMPLE Bilateral 03/18/2023   Procedure: (RCC) BILATERAL MASTECTOMY, SIMPLE, COMPLETE;  Surgeon: Dinome, Maggie Lee, MD;  Location: [facility];  Service: General Surgery;  Laterality: Bilateral;  . BIOPSY/EXCISION LYMPH NODE AXILLARY Left 03/18/2023   Procedure: Left BIOPSY OR EXCISION OF LYMPH NODE(S); OPEN, DEEP AXILLARY NODE(S);  Surgeon: Dinome, Maggie Lee, MD;  Location: [facility];  Service: General Surgery;  Laterality: Left;    FAMILY and SOCIAL HISTORIES: Social History    Tobacco Use  . Smoking status: Never  . Smokeless tobacco: Never  Vaping Use  . Vaping status: Never Used  Substance Use Topics  . Alcohol use: Not Currently  . Drug use: Never   family history includes Cancer in her maternal aunt and paternal aunt; High blood pressure (Hypertension) in her father and mother.  PHYSICAL EXAMINATION:  Vitals:   07/13/23 1448  BP: 128/83  Pulse: 84  Resp: 18  Temp: 36.8 C (98.3 F)  TempSrc: Oral  SpO2: 98%  Weight: (!) 134.9 kg (297 lb 6.4 oz)  Height: 173 cm (5' 8.11)  PainSc: 0-No pain   Body mass index is 45.07 kg/m. Wt Readings from Last 3 Encounters:  07/13/23 (!) 134.9 kg (297 lb 6.4 oz)  04/20/23 (!) 133.8 kg (294 lb 15.6 oz)  04/13/23 (!) 133.3 kg (293 lb 14 oz)    General: Well appearing, African American woman, talkative in no acute distress.  Lymph: no lymphadenopathy HEENT: [place]/AT. PERRL, EOMI, sclera anicteric.  Oral mucosa pink, moist.  No oral lesions.  Chest: Lungs clear to auscultation bilaterally.  Respirations regular. Heart: Regular rate and Regular rhythm. S1 and S2 present. No audible murmur, rub, or gallop.    Abdomen: Active bowel sounds throughout. Soft, non-distended,  non-tender. No palpable hepatosplenomegaly, or masses. Genitourinary: not examined Extremities: No cyanosis or edema in the upper or lower extremities.  Skin: Warm, dry and intact. No rash. Neurologic: Alert and oriented x 3.  CN nerves intact. No focal deficits.  LABORATORY DATA: Reviewed  PATHOLOGY REVIEW: 03/18/2023:  A. Left breast, simple mastectomy: Multifocal low grade invasive ductal carcinoma of the breast (up to 12 mm) with focal ductal carcinoma in situ associated with prior biopsy sites.   Margin status: grossly widely negative (18 mm).   Additional findings: Benign skin and nipple Biopsy site changes, 3   See synoptic report below.   Breast biomarkers were performed on the previous biopsy and will not be repeated.   B.  Left axillary sentinel lymph node, excision: One lymph node, negative for metastatic carcinoma (0/1), see comment. Comment: This lymph node is being referred to our hematopathology division for further review, and an addendum will follow.     C. Right breast, simple mastectomy: Benign breast with dense fibrosis, cysts, and sclerosing adenosis.  Benign skin and nipple.   RADIOLOGY REVIEW: 03/05/2023 PET CT 1.  Hypermetabolic left breast mass measuring up to 1.3 cm, correlating with prior mammograms and in keeping with biopsy-proven malignancy. 2.  No evidence of hypermetabolic nodal or distant metastasis.  IMPRESSION(S): Takeira Labarge is a 48 y.o. woman with new diagnosis of lymphoma which was incidentally found during evaluation for left breast cancer upon core needle biopsy of the left axilla. She has left breast intraductal carcinoma grade 1, multifocal, ER/PR positive and just underwent bilateral mastectomy on 03/18/2023.  In the mastectomy specimen, there was no evidence of lymphoma. PET/CT staging on 03/05/2023 did not show any evidence of nodal disease. Follow up PET/CT today 7/8 also without disease on my review (also called radiology and on their initial review, did not see evidence of nodal disease). She comes today for consultation about next steps in evaluation and management.  She feels well and is recovering from the mastectomy. She has no symptoms referable to lymphoma.  PLAN(S): Assessment & Plan Lymphoma Biopsy indicated ' concern for' diffuse large B cell lymphoma with T cell rich background, but sample was small and excisional biopsy was recommended. PET CT showed minimal disease with only a breast mass. Larger biopsy not feasible. Follow up specimen from mastectomy was with no evidence of lymphoma. Reassessment with PET CT today without nodal disease on my read -pending final read. Discussed with patient that it is possible that initial read of DLBCL is an anomaly. Reassured by  follow up excisional biopsy and PET scan without nodal disease. Will plan to observe peripherally with follow up appointment in 1 year and we will plan to review further scans that she may get as a part of her breast cancer follow up.  - follow up official PETCT interpretation - follow up in 1 year  Breast Cancer Diagnosed after routine mammogram. Biopsy confirmed, mastectomy performed. Lymph nodes clear, no chemotherapy needed per breast oncologist. Continued on Tamoxifen. - continue to follow with Dr. Ronalee.   Multiple Sclerosis (MS) Significant symptoms include dizziness, vomiting, diplopia, gait disturbance, weight gain, cognitive and memory issues. Ocrevus discontinued due to cancer concerns. Discussed with patient that as we are not planning to start chemotherapy at this time, she should continue MS treatment.  - Follow up with neurologist   Return in 1 year.    AVERIE KALVIN [facility], MD ELOIS [facility] Internal Medicine, PGY3

## 2023-08-16 DIAGNOSIS — G35 Multiple sclerosis: Secondary | ICD-10-CM | POA: Diagnosis not present

## 2023-08-16 NOTE — Progress Notes (Signed)
 This video encounter was conducted with the patient's (or proxy's) verbal consent via secure, interactive audio and video telecommunications while in clinic/office/hospital.  The patient (or proxy) was instructed to have this encounter in a suitably private space and to only have persons present to whom they give permission to participate. In addition, patient identity was confirmed by use of name plus an additional identifier.  This visit was coded based on medical decision making (MDM).     Breast Clinic F/u Consultation Evaluation Medical Oncology     Identifying Statement: Angel Reilly [id number] is a 48 y.o. female from [facility] [place] [address] seen for invasive breast cancer.  Physician Requesting Consultation: Self Patient Care Team: Healthcare, Cloretta as PCP - General Wang, Jie, MD as Consulting Provider (Oncology) Zhou, Guiyun, NP as Nurse Practitioner (Oncology) [place], RN as Registered Nurse Ronalee Fix, MD as Consulting Provider (Oncology)  Oncologic History:  Oncology History Overview Note  L breast IDC grade 1, multifocal, 1.2cm, 0/1LN, ER 90%  PR 30%  HER2 1+   Diagnosis: Screening mammogram 12/24/2022: Right: No mammographic evidence of malignancy.  No suspicious mass, architectural distortion, or calcifications. Left: In the upper left breast posterior depth there is a 1.0 x 0.9 cm mass with indistinct margins and associated architectural distortion (CC slice 51/87 and MLO slice 54/84). Diagnostic left breast mammogram 01/07/2023: Spot compression tomosynthesis oh CC and MLO views of the left breast were obtained.  In addition, full-field true lateral tomosynthesis imaging was performed.  1.0 x 0.9 cm mass with indistinct margins and associated architectural distortion at the 12:00 axis, posterior depth approximately 8.4 cm from the nipple (ML slice 55/99; CC slice 55/97; MLO slice 55/91).  There is an additional 0.8 cm asymmetry middle depth, approximately 7.0 cm  from the nipple (ML slice 55/99). Diagnostic left breast ultrasound 01/07/2023: Targeted grayscale and color Doppler ultrasound of the left breast at the 12:00 axis was performed.  There are 2 hypoechoic masses in the upper left breast, spanning approximately 5.4 cm (series 1 image 2000). There is a 1.0 x 1.0 x 0.9 cm hypoechoic round mass with indistinct margins located at the 12:00 axis approximately 12 cm from the nipple.  This mass was biopsied under ultrasound guidance on 01/08/2023 with the postprocedure mammogram demonstrating the biopsy clip in appropriate position. There is an additional 0.6 x 0.6 x 0.6 cm round mass with indistinct margins located at the 12:00 axis approximately 5 cm from the nipple.  This mass was also biopsied under ultrasound guidance on 01/08/2023 with the postprocedure mammogram demonstrating the biopsy clip in appropriate position. Limited ultrasound of the left axilla was performed demonstrating a left axillary lymph node with lobulated cortical thickening measuring up to 0.3 cm.  This lymph node was biopsied under ultrasound guidance on 01/08/2023, demonstrating atypical lymphoid proliferation. Hydromark clip was reportedly placed, but not visualized on post-procedure mammogram.  Bx - c/w 2 masses IDC, LN negative     Treatment: Surg Onc - Dinome - bilateral mastectomies - 1.2cm, 0/2 LN  Med Onc - [place] - RS score of 9, no benefit for adjuvant chemotherapy, recommendation for adjuvant ET - start tamoxifen 04/2023, goal at least 5 years (04/2028)  Rad Onc- [place]  Genetics negative  Relevant hx Multiple sclerosis    Invasive ductal carcinoma of breast, female, left (CMS/HHS-HCC)  01/08/2023 Biopsy   A.  Outside case, [id number], [place] [facility], [place], [place].  Date of procedure 01/08/23:    A.  Left breast, 12:00,  12 cm from nipple, ribbon clip, needle core biopsy:    Invasive adenocarcinoma of the breast (5 mm on core). Histologic type:  ductal Provisional Nottingham combined histologic grade: 1 of 3 Tubule formation score: 2 Nuclear pleomorphism score: 2 Mitotic rate score:  1   In-situ carcinoma: absent   Breast cancer biomarker studies (per outside report, slides not provided for review): Estrogen receptor:   positive (90% staining, moderate-strong intensity) Progesterone receptor:  positive (95% staining, moderate-strong intensity) Her2/neu immunohistochemistry:  negative (score = 1+)     B.  Left breast, 12:00, 5 cm from nipple, venus clip, needle core biopsy:    Invasive adenocarcinoma of the breast (3 mm on core), morphologically similar to specimen A, above.   Breast cancer biomarker studies (per outside report, slides not provided for review): Estrogen receptor:   positive (90% staining, strong intensity) Progesterone receptor:  positive (30% staining, strong intensity) Her2/neu immunohistochemistry:  negative (score = 1+)   C.  Lymph node, left axilla, hydromark butterfly clip, needle core biopsy:    Negative for metastatic carcinoma.  See comment.   Comment: The lymph node biopsies are negative for metastatic carcinoma.  However, these biopsies are being referred to our hematopathology division for further review, and an addendum will follow with additional review.   01/08/2023 Biopsy   [facility] Review:   DIAGNOSIS A. Outside case, [id number], [place] [facility], [place], [place]. Dateofprocedure 01/08/23: A. Left breast, 12:00, 12 cm from nipple, ribbon clip, needle core biopsy: Invasive adenocarcinoma of the breast (5 mm on core). Histologic type: ductal Provisional Nottingham combined histologic grade: 1 of 3  Tubule formation score: 2  Nuclear pleomorphism score: 2  Mitotic rate score: 1 In-situ carcinoma: absent Breast cancer biomarker studies (per outside report, slides not provided for review): Estrogen receptor: positive (90% staining, moderate-strong intensity) Progesterone receptor:  positive (95% staining, moderate-strong intensity) Her2/neu immunohistochemistry: negative (score = 1+) B. Left breast, 12:00, 5 cm from nipple, venus clip, needle core biInvasive adenocarcinoma of the breast (3 mm on core), morphologically similar tospecimenA, above. Breast cancer biomarker studies (per outside report, slides not provided for review):  C. Lymph node, left axilla, hydromark butterfly clip, needle core biopsy: Negative for metastatic carcinoma. See comment. Comment: The lymph node biopsies are negative for metastatic carcinoma. However, thesebiopsies are being referred to our hematopathology division for further review, andanaddendum will follow with additional    01/21/2023 Radiology Study   [facility] Outside image interpretation: Left US  and mammogram  Two hypoechoic masses with indistinct margins at the 12:00 axis of the left breast in the posterior depth, respectively.  These masses span a distance of approximately 5.4 cm and were biopsied under ultrasound guidance on 01/08/2023 demonstrating multifocal invasive ductal carcinoma.  BI-RADS 6. Left axillary lymph node with lobulated cortical thickening.  This lymph node was biopsied on 01/08/2023, demonstrating atypical lymphoid proliferation. Excision was recommended by pathology. Hydromark clip was reportedly placed, but not visualized on post-procedure mammogram.   01/28/2023 Initial Diagnosis   Invasive ductal carcinoma of breast, female, left (CMS/HHS-HCC)    Surgery      Interval Hx: History of Present Illness Angel Reilly is a 48 year old female with multiple sclerosis and breast cancer on tamoxifen who presents with fatigue and night sweats.  She experiences significant fatigue and night sweats, which began after starting tamoxifen. Her sleep is often disrupted, with frequent awakenings and typically no more than six hours of sleep. Melatonin provides some relief but may contribute  to her fatigue.  She experiences arm and foot  pain, which she attributes to multiple sclerosis. She has not received multiple sclerosis treatment this year, which may be contributing to her symptoms.  She is concerned about the impact of tamoxifen side effects on her quality of life, especially in conjunction with her multiple sclerosis symptoms. She has experienced weight gain since starting tamoxifen, which may affect her ability to undergo reconstructive surgery due to a high BMI. She is actively trying to lose weight, which is challenging due to limited mobility from multiple sclerosis.  She is part of a support group for individuals on tamoxifen, where she has heard about others experiencing severe side effects. Despite this, she is not willing to risk her cancer treatment due to these side effects. She is awaiting reconstructive surgery, which has been delayed due to her high BMI.  Review of Systems: She denies any fevers, chills, nausea, vomiting, diarrhea or constipation. She has no new skin rashes or growths.  She denies headaches or dizziness.  She has no unusual numbness and denies focal weakness.  She denies shortness of breath, cough, lower extremity swelling.  Review of systems is otherwise unremarkable.   Past Medical History: Past Medical History:  Diagnosis Date  . Depression   . Multiple sclerosis (CMS/HHS-HCC) 05/18/2019     Past Surgical History: Past Surgical History:  Procedure Laterality Date  . CARPAL TUNNEL RELEASE  2013  . REPAIR BLEPHAROPTOSIS W/LEVATOR MUSCLE RESECTION EXTERNAL APPROACH Left 07/23/2020   Procedure: REPAIR OF BLEPHAROPTOSIS; (TARSO) LEVATOR RESECTION OR ADVANCEMENT, EXTERNAL APPROACH;  Surgeon: Harriette Mliss Caldron, MD;  [facility];  Service: Ophthalmology;  Laterality: Left;  . BLEPHAROPLASTY UPPER EYELID Bilateral 07/23/2020   Procedure: BLEPHAROPLASTY, UPPER EYELID; WITH EXCESSIVE SKIN WEIGHTING DOWN LID;  Surgeon: Harriette Mliss Caldron, MD;  [facility];  Service:  Ophthalmology;  Laterality: Bilateral;  . MASTECTOMY BILATERAL SIMPLE Bilateral 03/18/2023   Procedure: (RCC) BILATERAL MASTECTOMY, SIMPLE, COMPLETE;  Surgeon: Dinome, Maggie Lee, MD;  [facility];  Service: General Surgery;  Laterality: Bilateral;  . BIOPSY/EXCISION LYMPH NODE AXILLARY Left 03/18/2023   Procedure: Left BIOPSY OR EXCISION OF LYMPH NODE(S); OPEN, DEEP AXILLARY NODE(S);  Surgeon: Dinome, Maggie Lee, MD;  [facility];  Service: General Surgery;  Laterality: Left;    Family History: Family History  Problem Relation Age of Onset  . High blood pressure (Hypertension) Mother   . High blood pressure (Hypertension) Father   . Cancer Maternal Aunt        Breast Cancer-Mascetomy  . Cancer Paternal Aunt        Colon Cancer-Deceased  . Graves' disease Neg Hx   . Thyroid  disease Neg Hx   . Anesthesia problems Neg Hx      Social History: Social History   Socioeconomic History  . Marital status: Single  Tobacco Use  . Smoking status: Never  . Smokeless tobacco: Never  Vaping Use  . Vaping status: Never Used  Substance and Sexual Activity  . Alcohol use: Not Currently  . Drug use: Never  . Sexual activity: Not Currently    Partners: Male    Birth control/protection: Abstinence   Social Drivers of Health    Received from [facility]   Social Network  Housing Stability: Unknown (02/10/2023)   Housing Stability Vital Sign   . Homeless in the Last Year: No     Psycho-Social History: Social History   Socioeconomic History  . Marital status: Single  Tobacco  Use  . Smoking status: Never  . Smokeless tobacco: Never  Vaping Use  . Vaping status: Never Used  Substance and Sexual Activity  . Alcohol use: Not Currently  . Drug use: Never  . Sexual activity: Not Currently    Partners: Male    Birth control/protection: Abstinence   Social Drivers of Health    Received from [facility]   Social Network  Housing Stability: Unknown (02/10/2023)   Housing  Stability Vital Sign   . Homeless in the Last Year: No    Allergies: No Known Allergies   Current Medications: Current Outpatient Medications  Medication Sig Dispense Refill  . ergocalciferol , vitamin D2, 1,250 mcg (50,000 unit) capsule Take 50,000 Units by mouth once a week    . OCREVUS 30 mg/mL injection Infuse 600mg  ( ) intravenously every 6 months (Patient not taking: Reported on 04/20/2023) 20 mL 1  . tamoxifen (NOLVADEX) 20 MG tablet TAKE 1 TABLET BY MOUTH EVERY DAY 90 tablet 1  . tiZANidine (ZANAFLEX) 4 MG tablet Take 1 tablet (4 mg total) by mouth 3 (three) times daily as needed 270 tablet 3   No current facility-administered medications for this visit.      Objective:    Physical Exam: There were no vitals taken for this visit.There is no height or weight on file to calculate BSA.  Constitutional: alert, interactive with provider, cooperative, in no distress Mental status: oriented x 3, good historian, appropriate mood and behavior, thought content appears normal Respiratory: no respiratory distress, no audible wheezing  Laboratory:    Chemistry      Component Value Date/Time   NA 137 07/13/2023 1442   K 3.9 07/13/2023 1442   CL 109 (H) 07/13/2023 1442   CO2 20 (L) 07/13/2023 1442   BUN 12 07/13/2023 1442   CREATININE 0.8 07/13/2023 1442   GLUCOSE 99 07/13/2023 1442      Component Value Date/Time   CALCIUM 9.0 07/13/2023 1442   ALKPHOS 79 07/13/2023 1442   AST 19 07/13/2023 1442   ALT 20 07/13/2023 1442   TBILI 0.6 07/13/2023 1442       Last CBC and neutrophil count: Lab Results  Component Value Date   HGB 12.8 07/13/2023   Lab Results  Component Value Date   HCT 37.0 07/13/2023   Lab Results  Component Value Date   PLT 276 07/13/2023   Lab Results  Component Value Date   WBC 3.9 07/13/2023   Lab Results  Component Value Date   NEUTOPHILPCT 47.1 07/13/2023   Lab Results  Component Value Date   NEUTCT 1.8 (L) 07/13/2023      Results     Procedure Component Value Units Date/Time   Pathology - Slide Consult [[id number]] Collected: 01/08/23   Specimen: Tissue-Pathology from Breast Updated: 01/28/23 1033    Case Report --    Surgical Pathology Report                         Case: [id number]                                 Authorizing Provider:  Dinome, Maggie Lee, MD     Collected:           01/08/2023                  Ordering Location:     [facility]  Received:            01/27/2023 1452             Pathologist:           Alejos Tresea BROCKS, MD                                                          Specimen:    Breast, [id number]                                                                          DIAGNOSIS --    A.  Outside case, [id number], [place] - [facility], [place], [place].  Date of procedure 01/08/23:   A.  Left breast, 12:00, 12 cm from nipple, ribbon clip, needle core biopsy:   Invasive adenocarcinoma of the breast (5 mm on core). Histologic type: ductal Provisional Nottingham combined histologic grade: 1 of 3 Tubule formation score: 2 Nuclear pleomorphism score: 2 Mitotic rate score:  1  In-situ carcinoma: absent  Breast cancer biomarker studies (per outside report, slides not provided for review): Estrogen receptor:   positive (90% staining, moderate-strong intensity) Progesterone receptor:  positive (95% staining, moderate-strong intensity) Her2/neu immunohistochemistry:  negative (score = 1+)   B.  Left breast, 12:00, 5 cm from nipple, venus clip, needle core biopsy:   Invasive adenocarcinoma of the breast (3 mm on core), morphologically similar to specimen A, above.  Breast cancer biomarker studies (per outside report, slides not provided for review): Estrogen receptor:   positive (90% staining, strong intensity) Progesterone receptor:  positive (30% staining, strong intensity) Her2/neu immunohistochemistry:  negative (score = 1+)  C.  Lymph node, left  axilla, hydromark butterfly clip, needle core biopsy:   Negative for metastatic carcinoma.  See comment.  Comment: The lymph node biopsies are negative for metastatic carcinoma.  However, these biopsies are being referred to our hematopathology division for further review, and an addendum will follow with additional review.      Clinical Information --    [facility] review of outside slides is requested.      Gross Examination --    Outside case A:              [id number] Date of surgery:              01/08/23 Number of stained slides:             3 Number of blocks:                        0 Number of unstained slides:         0  Outside path report received?    Yes Material to be returned?             Yes  Received from:   [facility]  Pathology Laboratory  [address]  [place], [place] [address]  P:  [phone]  F:  [phone]     Microscopic Examination --  Microscopic examination is performed.     Additional Documentation --    All immunohistochemistry, in situ hybridization tests and special stains performed at [facility] and reported herein were developed, validated and their performance characteristics determined by the [facility]. During the performance of these tests, appropriate positive and negative control slides are also performed and reviewed. All control slides and internal controls (when applicable) demonstrate the expected immunoreactive patterns and/or nucleic acid hybridization. These ancillary studies were deemed medically necessary by the requesting pathologist. They were ordered following review of the H&E and clinical history except when part of a liver/kidney protocol or where clinical history (e.g. immunocompromised, critically ill, history of malignancy) clearly indicates. Some of the tests may not be cleared or approved by the U.S. Food and Drug Administration (FDA).  The FDA has determined that  such clearance or approval is not necessary.  These tests are used for clinical purposes and should not be regarded as investigational or as research.  This laboratory is certified under the Clinical Laboratory Improvement Amendments of 1988 (CLIA) as qualified to perform high complexity clinical testing.     Attestation --    All of the diagnostic evaluations on the enumerated specimens have been personally conducted by the pathologists involved in the care of this patient as indicated by the electronic signatures above.        Imaging: Mammo interpretation of outside film  Result Date: 01/22/2023 Two hypoechoic masses with indistinct margins at the 12:00 axis of the left breast in the posterior depth, respectively.  These masses span a distance of approximately 5.4 cm and were biopsied under ultrasound guidance on 01/08/2023 demonstrating multifocal invasive ductal carcinoma.  BI-RADS 6. Left axillary lymph node with lobulated cortical thickening.  This lymph node was biopsied on 01/08/2023, demonstrating atypical lymphoid proliferation. Excision was recommended by pathology. Hydromark clip was reportedly placed, but not visualized on post-procedure mammogram. PLEASE NOTE:  Our interpretation of studies performed at an outside institution is limited by factors including the absence of technical specifics of the image, undisclosed clinical information and the unavailability of the original interpretation.  Specialists at the institution that performed the study may have access to information not available to us  that could make a difference in this interpretation.  It may be beneficial to obtain the original interpretation from the site where the study was performed.   Mammo ultrasound breast interpretation of outside film  Result Date: 01/22/2023 Two hypoechoic masses with indistinct margins at the 12:00 axis of the left breast in the posterior depth, respectively.  These masses span a distance of  approximately 5.4 cm and were biopsied under ultrasound guidance on 01/08/2023 demonstrating multifocal invasive ductal carcinoma.  BI-RADS 6. Left axillary lymph node with lobulated cortical thickening.  This lymph node was biopsied on 01/08/2023, demonstrating atypical lymphoid proliferation. Excision was recommended by pathology. Hydromark clip was reportedly placed, but not visualized on post-procedure mammogram. PLEASE NOTE:  Our interpretation of studies performed at an outside institution is limited by factors including the absence of technical specifics of the image, undisclosed clinical information and the unavailability of the original interpretation.  Specialists at the institution that performed the study may have access to information not available to us  that could make a difference in this interpretation.  It may be beneficial to obtain the original interpretation from the site where the study was performed.      Results LABS Estrogen receptor: 90% Progesterone receptor: 30% HER2: Negative  RADIOLOGY  Mammogram: Two areas biopsied; largest 1 cm, smaller 0.6 cm  PATHOLOGY Lymph node biopsy: No breast cancer Biopsy: Invasive ductal carcinoma, grade 1   LABS Oncotype DX: 9  RADIOLOGY PET scan: Negative  PATHOLOGY 1.2 cm tumor, negative lymph node     Imaging, labs and pathology personally reviewed and discussed with patient.  Impression/Plan:   Angel Reilly is a very pleasant 48 y.o. woman w/ breast cancer as above who presents in consultation for consideration of systemic therapy.  #Invasive Breast Cancer, stage I, ER/PR+ HER2-  Reviewed final pathology now s/p bilateral mastectomy showing 1.2cm disease 0/1 LN. RS score low thus no benefit for adjuvant chemotherapy, plan for adj ET w/ tamoxifen.  - Tamoxifen 05/2023, goal at least 5 years (04/2028) - pt now s/p bilateral mastectomies, does not need RT, plan for surveillance w/ breast exams does not need screening  imaging  - Was told she needs to lose weight for reconstructive surgery. Referral to [facility].   #Hot flashes - Gabapentin 200 mg QHS - Magnesium 200-400 mg QHS, cautioned your about diarrhea  #abnormal LN concerning for DLBCL - Initial LN bx was concerning for DLBCL, negative PET CT, final SLNBx on surg path was negative for [facility], pending hemepath review, referral to mal heme also placed, pt has f/u with Dr. Antonia Flatten  #Lymphedema - Referral to PT - Patient bought compression sleeve online   #Genetics: negative #Bone Health: On tamoxifen  #Pain or Palliative/Supportive care/Survivorship: Denies pain, has good support   #Access: None  #Clinical trials: Not a candidate currently for any open trials here. Could be a future trial candidate.  FOLLOW-UP: -F/u alternating with SurgOnc, MedOnc, RadOnc -PT referral -[facility]  Future Appointments     Date/Time Provider Department Center Visit Type   10/01/2023 2:30 PM (Arrive by 2:15 PM) Keenan Christobal Ditty, NP [facility] RETURN VISIT   10/18/2023 10:30 AM (Arrive by 10:15 AM) Arcadio Mclean, PA [facility] RETURN 30   07/11/2024 2:00 PM (Arrive by 1:45 PM) STUDIES/LAB-B[facility]   07/11/2024 3:00 PM (Arrive by 2:45 PM) Flatten Antonia, MD [facility] RETURN VISIT      Attending Attestation:   I spent 40 minutes with Angel Reilly and this includes face to face time and non-face to face time preparing to see the patient (eg, review of tests), obtaining and/or reviewing separately obtained history, independently interpreting results (not separately reported), communicating results to the patient/family/caregiver, and Care coordination (not separately reported). More than 50% of time was spent in face to face care of patient.  All questions were  answered to the best of my ability.   I personally performed this service.  Vernell Rummer, DNP, FNP-BC Division of Medical Oncology [facility] Survivorship- Sexual Health  This note has been created using automated tools and reviewed for accuracy by RACHEL FRANCES ROBERTS.

## 2023-09-24 ENCOUNTER — Ambulatory Visit: Attending: Medical Oncology | Admitting: Occupational Therapy

## 2023-09-24 ENCOUNTER — Encounter: Payer: Self-pay | Admitting: Occupational Therapy

## 2023-09-24 DIAGNOSIS — I972 Postmastectomy lymphedema syndrome: Secondary | ICD-10-CM | POA: Diagnosis present

## 2023-09-24 NOTE — Therapy (Incomplete Revision)
 OUTPATIENT OCCUPATIONAL THERAPY EVALUATION  BUE/BUQ POST MASTECTOMY LYMPHEDEMA  Patient Name: ARDELLE HALIBURTON MRN: [id number] DOB:[birth year], 48 y.o., female Today's Date: 09/24/2023  END OF SESSION:  OT End of Session - 09/24/23 0820     Visit Number 1    Date for Recertification  12/23/23    OT Start Time 0800    OT Stop Time 0910    OT Time Calculation (min) 70 min    Activity Tolerance Patient tolerated treatment well;No increased pain    Behavior During Therapy WFL for tasks assessed/performed            Past Medical History:  Diagnosis Date   Abnormal brain MRI 05/24/2019   Abnormal Pap smear of cervix    History of cervical dysplasia 01/15/2017   Pap smear abnormality of cervix/human papillomavirus (HPV) positive    Peripheral edema 02/23/2020   Post partum depression    Past Surgical History:  Procedure Laterality Date   BREAST BIOPSY Left 2000   benign results   BREAST BIOPSY Left 2018   us / bx/clip-neg fibradenoma   BREAST BIOPSY Left    us  BX Ribbon clip path pedning-   BREAST BIOPSY Left    US  BX Venus clip path pending-   BREAST BIOPSY Left    US  BX Butterfly clip path pending-   BREAST BIOPSY Left 01/08/2023   US  LT BREAST BX W LOC DEV 1ST LESION IMG BX SPEC US  GUIDE 01/08/2023 [facility]   BREAST BIOPSY Left 01/08/2023   US  LT BREAST BX W LOC DEV EA ADD LESION IMG BX SPEC US  GUIDE 01/08/2023 [facility]   DILATION AND CURETTAGE OF UTERUS     WISDOM TOOTH EXTRACTION     Patient Active Problem List   Diagnosis Date Noted   Conjunctivitis 10/29/2022   Prediabetes 10/29/2022   GAD (generalized anxiety disorder) 11/05/2021   Morbid obesity with BMI of 40.0-44.9, adult (HCC) 02/23/2020   Multiple sclerosis (HCC) 07/03/2019   High risk medication use 07/03/2019   Diplopia 05/24/2019   Balance disorder 05/24/2019   Low vitamin D  level 05/24/2019   Hypersomnia 05/24/2019   Anxiety and depression 06/07/2017   Atypical squamous cells of  undetermined significance (ASC-US ) on cervical Pap smear 03/09/2014   Carpal tunnel syndrome, unspecified upper limb 04/28/2010    PCP: [place]er MARLA Gaskins, NP  REFERRING PROVIDER: Vernell Cathlean Rummer, NP  REFERRING DIAG: 197.2  THERAPY DIAG:  Bilateral Postmastectomy lymphedema syndrome  Rationale for Evaluation and Treatment: Rehabilitation  ONSET DATE: s/p March 2025 s/p bilateral mastectomy (L br ca)  SUBJECTIVE:  SUBJECTIVE STATEMENT:  Lanny D Kunzman is referred to Occupational Therapy by Vernell Rummer, NP, for evaluation and treatment of LUE/LUQ post mastectomy lymphedema. Pt states, At first I thought I was just retaining a lot of fluid. Swelling in my upper body started after my surgery.   PERTINENT HISTORY:  MS, Swelling in feet and legs, anxiety and depression, B CTR, Insomnia, Pre diabetes, obesity (BMI 40-44.9), balance disorder,   Oncology Hx L breast intraductalcarcinoma grade 1, multifocal ER/PR+. 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   PAIN:  Are you having pain? No NPRS scale: 0/10 Pain location: bilateral lateral trunk, shoulders, axillae Pain orientation: Right and Left  PAIN TYPE: discomfort, soreness,  Pain description: intermittent  Aggravating factors: shoulder flexion and abduction Relieving factors: resting arms  PRECAUTIONS: Other: LYMPHEDEMA  FALLS:  Has patient fallen in last 6 months? No  LIVING ENVIRONMENT: Lives with: lives with their daughter Lives in: House/apartment Stairs: Yes: Internal: 15 steps; can reach both Has following equipment at home: Single point cane and Quad cane small base  OCCUPATION: Analyst,  full time, sedentary  LEISURE: walks 4 x weekly for 20 min, exercise video 4 x weekly, spending time with her 13 yo daughter, book club, being with friends  HAND DOMINANCE: left   PRIOR LEVEL OF FUNCTION: Independent  PATIENT GOALS: get my AROM back, pain management, learn about compression options   OBJECTIVE: Note: Objective measures were completed at Evaluation unless otherwise noted.  COGNITION: Overall cognitive status: Within functional limits for tasks assessed   OBSERVATIONS / OTHER ASSESSMENTS:   POSTURE: WFL  UPPER EXTREMITY AROM/PROM:  A/PROM RIGHT   eval   Shoulder extension WNL  Shoulder flexion 160/180  Shoulder abduction 100/180  Shoulder internal rotation WNL  Shoulder external rotation WNL    (Blank rows = not tested)  A/PROM LEFT   eval  Shoulder extension WNL  Shoulder flexion 160/180  Shoulder abduction 100/180  Shoulder internal rotation WNL  Shoulder external rotation WNL    (Blank rows = not tested)  UPPER EXTREMITY STRENGTH: WNL; limited R grip strength  LOWER EXTREMITY AROM/PROM: WNL  LOWER EXTREMITY MMT: N/T  LYMPHEDEMA ASSESSMENTS:   SURGERY TYPE/DATE: 01/08/23  NUMBER OF LYMPH NODES REMOVED: ? 6  CHEMOTHERAPY: no  RADIATION:no  HORMONE TREATMENT: Tamoxifen  INFECTIONS: denies    HX SEROMA: denies   RECONSTRUCTION: none to date. Planning tissue expanders after losing some weight  LYMPHEDEMA ASSESSMENTS:   BUE COMPARATIVE LIMB VOLUMETRICS TBA OT Rx 1  LANDMARK RIGHT  08/27/21  R LEG (A-D) N/A  R THIGH (E-G) ml  R FULL LIMB (A-G) ml  Limb Volume differential (LVD)  %  Volume change since initial %  Volume change overall V  (Blank rows = not tested)  LANDMARK LEFT  08/27/21  R LEG (A-D) N/A  R THIGH (E-G) ml  R FULL LIMB (A-G) ml  Limb Volume differential (LVD)  %  Volume change since initial %  Volume change overall %  (Blank rows = not tested)    Stage 0, Subclinical, Bilateral, Post-mastectomy Lymphedema    Skin  Description Hyper-Keratosis Peau d' Orange Shiny Tight Fibrotic/ Indurated Fatty Doughy Spongy/ boggy       Mastectomy scars well healed and mobile. Palpable fibrotic scar;  No palpable cording x     Skin dry Flaky WNL Macerated  x    Color Redness Varicosities Blanching Hemosiderin Stain Mottled           Odor Malodorous Yeast Fungal infection  WNL      x   Temperature Warm Cool wnl     x    Pitting Edema   1+ 2+ 3+ 4+ Non-pitting         x   Girth Symmetrical Asymmetrical                   Distribution   TBA Rx visit 1  BUE/BUQ    Stemmer Sign Positive Negative    x   Lymphorrhea History Of:  Present Absent     x    Wounds History Of Present Absent Venous Arterial Pressure Sheer     x        Signs of Infection Redness Warmth Erythema Acute Swelling Drainage Borders                    Sensation Light Touch Deep pressure Hypersensitivity   In tact Impaired Present Impaired Absent Impaired    x  x x     Nails WNL   Fungus nail dystrophy   x     Hair Growth Symmetrical Asymmetrical   x    Skin Creases Base of toes  Ankles   Base of Fingers knees       Abdominal pannus Thigh Lobules  Face/neck             GAIT: Distance walked: >500' Assistive device utilized: None Level of assistance: Complete Independence  Lymphedema Life Impact Scale (LLIS):  INITIAL 09/24/23: 32.35% (The extent to which lymphedema-related problems impacted your life last week.)   TODAY'S TREATMENT:                                                                                                                              OT evaluation Pt edu  PATIENT EDUCATION:  Education details: Eval edu Discussed differential diagnoses for various swelling disorders. Provided basic level education regarding lymphatic structure and function, etiology, onset patterns, stages of progression, and prevention to limit infection risk, worsening condition and further  functional decline. Pt edu for aught interaction between blood circulatory system and lymphatic circulation.Discussed  impact of gravity and co-morbidities on lymphatic function. Outlined Complete Decongestive Therapy (CDT)  as standard of care and provided in depth information regarding 4 primary components of Intensive and Self Management Phases, including Manual Lymph Drainage (MLD), compression wrapping and garments, skin care, and therapeutic exercise. Dempsey discussion with re need for frequent attendance and high burden of care when caregiver is needed, impact of co morbidities. We discussed  the chronic, progressive nature of lymphedema and Importance of daily, ongoing LE self-care essential for limiting progression and infection risk.  Person educated: Patient  Education method: Explanation, Demonstration, and Handouts Education comprehension: verbalized understanding, returned demonstration, verbal cues required, and needs further education  HOME EXERCISE PROGRAM: .  ASSESSMENT:  CLINICAL IMPRESSION: Catherina D Leone is referred to Occupational Therapy by Vernell Cathlean Rummer, NP, for evaluation and treatment of post mastectomy lymphedema.  OBJECTIVE IMPAIRMENTS: decreased activity tolerance, decreased knowledge of condition, decreased knowledge of use of DME, decreased ROM, decreased strength, increased edema, impaired sensation, obesity, and pain.   ACTIVITY LIMITATIONS: Basic and instrumental ADLs, Productive/ work activities, leisure pursuits, social participation, Quality of life  PERSONAL FACTORS: 1-2 comorbidities: MS, Obesity are also affecting patient's functional outcome.   REHAB POTENTIAL: Good  EVALUATION COMPLEXITY: Moderate  GOALS: Goals reviewed with patient? Yes  SHORT TERM GOALS: Target date: 4th OT Rx visit   Baseline: Goal status: INITIAL  2.   Baseline:  Goal status: INITIAL   LONG TERM GOALS: Target date: 12/23/23   Baseline:  Goal status:  INITIAL  2.   Baseline:  Goal status: INITIAL  3.   Baseline:  Goal status: INITIAL  4.   Baseline:  Goal status: INITIAL  5.   Baseline:  Goal status: INITIAL Goal status: INITIAL  PLAN:  PT FREQUENCY: 1-2x/week  PT DURATION: 8 weeks  PLANNED INTERVENTIONS: 97110-Therapeutic exercises, 97530- Therapeutic activity, 97140- Manual therapy, Patient/Family education, Taping, Manual lymph drainage, DME instructions, and myofascial release, scar massage, skin care to limit infection risk  PLAN FOR NEXT SESSION: BUE comparative limb volumetrics. chest/ trunk circumferential measurements. Anatomical measurements for compression arm sleeve/s, Pt edu for upper quadrant stretches if time allows   Zebedee Dec, MS, OTR/L, CLT-LANA 09/24/23 12:37 PM

## 2023-09-24 NOTE — Therapy (Addendum)
 OUTPATIENT OCCUPATIONAL THERAPY EVALUATION  BUE/BUQ POST MASTECTOMY LYMPHEDEMA  Patient Name: Angel Reilly MRN: [id number] DOB:28-Aug-1975, 48 y.o., female Today's Date: 09/24/2023  END OF SESSION:  OT End of Session - 09/24/23 0820     Visit Number 1    Date for Recertification  12/23/23    OT Start Time 0800    OT Stop Time 0910    OT Time Calculation (min) 70 min    Activity Tolerance Patient tolerated treatment well;No increased pain    Behavior During Therapy WFL for tasks assessed/performed            Past Medical History:  Diagnosis Date   Abnormal brain MRI 05/24/2019   Abnormal Pap smear of cervix    History of cervical dysplasia 01/15/2017   Pap smear abnormality of cervix/human papillomavirus (HPV) positive    Peripheral edema 02/23/2020   Post partum depression    Past Surgical History:  Procedure Laterality Date   BREAST BIOPSY Left 2000   benign results   BREAST BIOPSY Left 2018   us / bx/clip-neg fibradenoma   BREAST BIOPSY Left    us  BX Ribbon clip path pedning-   BREAST BIOPSY Left    US  BX Venus clip path pending-   BREAST BIOPSY Left    US  BX Butterfly clip path pending-   BREAST BIOPSY Left 01/08/2023   US  LT BREAST BX W LOC DEV 1ST LESION IMG BX SPEC US  GUIDE 01/08/2023 [facility]   BREAST BIOPSY Left 01/08/2023   US  LT BREAST BX W LOC DEV EA ADD LESION IMG BX SPEC US  GUIDE 01/08/2023 [facility]   DILATION AND CURETTAGE OF UTERUS     WISDOM TOOTH EXTRACTION     Patient Active Problem List   Diagnosis Date Noted   Conjunctivitis 10/29/2022   Prediabetes 10/29/2022   GAD (generalized anxiety disorder) 11/05/2021   Morbid obesity with BMI of 40.0-44.9, adult (HCC) 02/23/2020   Multiple sclerosis (HCC) 07/03/2019   High risk medication use 07/03/2019   Diplopia 05/24/2019   Balance disorder 05/24/2019   Low vitamin D  level 05/24/2019   Hypersomnia 05/24/2019   Anxiety and depression 06/07/2017   Atypical squamous cells of  undetermined significance (ASC-US ) on cervical Pap smear 03/09/2014   Carpal tunnel syndrome, unspecified upper limb 04/28/2010    PCP: [place] MARLA Gaskins, NP  REFERRING PROVIDER: Vernell Cathlean Rummer, NP  REFERRING DIAG: 197.2  THERAPY DIAG:  Bilateral Postmastectomy lymphedema syndrome  Rationale for Evaluation and Treatment: Rehabilitation  ONSET DATE: s/p March 2025 s/p bilateral mastectomy (L br ca)  SUBJECTIVE:  SUBJECTIVE STATEMENT:  Angel Reilly is referred to Occupational Therapy by Vernell Rummer, NP, for evaluation and treatment of LUE/LUQ post mastectomy lymphedema. Pt states, At first I thought I was just retaining a lot of fluid when my legs were swelling, and then swelling in my upper body started after my breast surgery. Pt reports one single LN was removed for biopsy. Pt reports swelling is primarily within the R upper extremity and quadrant, although the L side if the cancer effected side with ALND. Pt reports she is also having difficulty with reaching over her head to do her hair and to put things in cabinets.    PERTINENT HISTORY:  MS, Swelling in feet and legs, anxiety and depression, B CTR, Insomnia, Pre diabetes, obesity (BMI 40-44.9), balance disorder Oncology Hx L breast intraductalcarcinoma grade 1, multifocal ER/PR+. 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 No NPRS scale: 0/10 Pain location: bilateral lateral trunk, shoulders, axillae Pain orientation: Right and Left  PAIN TYPE: discomfort, soreness,  Pain description: intermittent  Aggravating factors: shoulder flexion and abduction Relieving factors:  resting arms  PRECAUTIONS: Other: LYMPHEDEMA  FALLS:  Has patient fallen in last 6 months? No  LIVING ENVIRONMENT: Lives with: lives with their daughter Lives in: House/apartment Stairs: Yes: Internal: 15 steps; can reach both Has following equipment at home: Single point cane and Quad cane small base  OCCUPATION: Analyst, full time, sedentary  LEISURE: walks 4 x weekly for 20 min, exercise video 4 x weekly, spending time with her 12 yo daughter, book club, being with friends  HAND DOMINANCE: left   PRIOR LEVEL OF FUNCTION: Independent  PATIENT GOALS: get my AROM back, pain management, learn about compression options   OBJECTIVE: Note: Objective measures were completed at Evaluation unless otherwise noted.  COGNITION: Overall cognitive status: Within functional limits for tasks assessed   OBSERVATIONS / OTHER ASSESSMENTS:   POSTURE: WFL  UPPER EXTREMITY AROM/PROM:  AROM RIGHT   eval   Shoulder extension WNL  Shoulder flexion 160/180  Shoulder abduction 100/180  Shoulder internal rotation WNL  Shoulder external rotation WNL    (Blank rows = not tested)  AROM LEFT   eval  Shoulder extension WNL  Shoulder flexion 160/180  Shoulder abduction 100/180  Shoulder internal rotation WNL  Shoulder external rotation WNL    (Blank rows = not tested)  UPPER EXTREMITY STRENGTH: WNL; limited R grip strength  LOWER EXTREMITY AROM/PROM: WNL  LOWER EXTREMITY MMT: N/T  LYMPHEDEMA ASSESSMENTS:   SURGERY TYPE/DATE: 01/08/23  NUMBER OF LYMPH NODES REMOVED: 0/2  CHEMOTHERAPY: none  RAD Onc: denies  HORMONE TREATMENT: Tamoxifen  INFECTIONS: denies    HX SEROMA: denies   RECONSTRUCTION: none to date. Considering tissue expanders after losing some weight  LYMPHEDEMA ASSESSMENTS:   BUE COMPARATIVE LIMB VOLUMETRICS TBA OT Rx 1  LANDMARK RIGHT    RUE (C-G) ml        Limb Volume differential (LVD)  %  Volume change since initial %  Volume change overall    (Blank rows = not tested)  LANDMARK LEFT   LUE (C-G)  LEG  ml     R FULL LIMB (A-G)   Limb Volume differential (LVD)  %  Volume change since initial %  Volume change overall %  (Blank rows = not tested)  CHEST CIRCUMFERENCES TBA OT Rx 1  LANDMARK   Circ at axillae N/A  Circ at nipple line ml  Circ at inframammary fold ml   %  Volume change since initial %  Volume change overall %  (Blank rows = not tested)   Stage 0, Subclinical, LUE/LUQ Post-mastectomy Lymphedema   Skin  Description Hyper-Keratosis Peau d' Orange Shiny Tight Fibrotic/ Indurated Fatty Doughy Spongy/ boggy       Mastectomy scars well healed and mobile bilaterally. Surgical scar tissue No palpable cording on L x     Skin dry Flaky WNL Macerated     x    Color WNL Varicosities Blanching Hemosiderin Stain Mottled   x        Odor Malodorous Yeast Fungal infection  WNL      x   Temperature Warm Cool wnl     x    Pitting Edema   1+ 2+ 3+ 4+ Non-pitting          x   Girth Symmetrical Asymmetrical                   Distribution   TBA Rx visit 1      Stemmer Sign Positive Negative    x   Lymphorrhea History Of:  Present Absent     x    Wounds History Of Present Absent Venous Arterial Pressure Sheer     x        Signs of Infection Redness Warmth Erythema Acute Swelling Drainage Borders                    Sensation Light Touch Deep pressure Hypersensitivity   In tact Impaired In tact Impaired Absent Impaired   x  x  x     Nails WNL   Fungus nail dystrophy   x     Hair Growth Symmetrical Asymmetrical   x    Skin Creases Base of toes  Ankles   Base of Fingers knees       Abdominal pannus Thigh Lobules  Face/neck             GAIT: Distance walked: >500' Assistive device utilized: None Level of assistance: Complete Independence  Lymphedema Life Impact Scale (LLIS):  INITIAL 09/24/23: 32.35% (The extent to which lymphedema-related problems impacted your life  last week.)   TODAY'S TREATMENT:                                                                                                                              OT evaluation Pt edu  PATIENT EDUCATION:  Education details: Eval edu Discussed differential diagnoses for various swelling disorders. Provided basic level education regarding lymphatic structure and function, etiology, onset patterns, stages of progression, and prevention to limit infection risk, worsening condition and further functional decline. Pt edu for aught interaction between blood circulatory system and lymphatic circulation.Discussed  impact of gravity and co-morbidities on lymphatic  function. Outlined Complete Decongestive Therapy (CDT)  as standard of care and provided in depth information regarding 4 primary components of Intensive and Self Management Phases, including Manual Lymph Drainage (MLD), compression wrapping and garments, skin care, and therapeutic exercise. Dempsey discussion with re need for frequent attendance and high burden of care when caregiver is needed, impact of co morbidities. We discussed  the chronic, progressive nature of lymphedema and Importance of daily, ongoing LE self-care essential for limiting progression and infection risk.  Person educated: Patient  Education method: Explanation, Demonstration, and Handouts Education comprehension: verbalized understanding, returned demonstration, verbal cues required, and needs further education   LYMPHEDEMA SELF-CARE HOME PROGRAM: BUE/BUQ lymphatic pumping there ex- 1 set of 10 each element, in order. Hold 5. 2 x daily  2. Prophylactic LUE compression arm sleeve, ccl 1 (20-30 mmHg) -Ready to wear if possible  3. Daily bilateral shoulder ther ex and flexibility stretches to increase AROM  4. Daily skin care with low ph lotion matching skin ph  5. Daily simple self MLD- prophylactic on L     ASSESSMENT:   CLINICAL IMPRESSION: Arin Vallone presents with  stage 0, subclinical, LUE/LUQ , post mastectomy lymphedema. BUE present with full shoulder AROM, but pain at end ranges of shoulder flexion and abduction. There is no palpable or visible evidence of L Axillary Web Syndrome. Pt is having difficulty performing functional activities in all occupational domains requiring overhead reaching, including grooming hair, upper body bathing, upper body dressing, home management chores, laundry, shopping and driving. Although lymphedema progression is lessened without radiation and with only 2 LN dissected, risk of progression is real and Pt will benefit from skilled Occupational Therapy to learn lymphedema precautions and self care. Pt will also benefit from skilled instruction for BUE shoulder AROM to limit further functional decline.    OBJECTIVE IMPAIRMENTS: Increased risk of lymphedema progression, Abnormal gait, decreased balance, decreased knowledge of condition, decreased knowledge of use of DME, decreased mobility, decreased strength, increased edema, impaired B shoulder AROM, and pain.    ACTIVITY LIMITATIONS: Basic and instrumental ADLs, (carrying, lifting,  overhead reaching, , bathing, dressing, and hygiene/grooming, driving, home maintenance), productive activities and work, leisure pursuits, and social participation   PERSONAL FACTORS: , Past/current experiences, Time since onset of injury/illness/exacerbation, are also affecting patient's functional outcome.    REHAB POTENTIAL: Good   CLINICAL DECISION MAKING: Stable/uncomplicated   EVALUATION COMPLEXITY: Mild  GOALS: Goals reviewed with patient? Yes   SHORT TERM GOALS: Target date:2nd  OT Rx visit    Pt will demonstrate understanding of lymphedema precautions and prevention strategies with modified independence using a printed reference to identify at least 5 precautions and discussing how s/he may implement them into daily life to reduce risk of progression with modified assistance Baseline:  max a Goal status: INITIAL   LONG TERM GOALS: Target date: 12/23/23/ DC     Given this patient's Intake score of 32.35 % on the Lymphedema Life Impact Scale (LLIS), patient will experience a reduction of at least 5% in her perceived level of functional impairment resulting from lymphedema to improve functional performance and quality of life (QOL).Baseline: tbd Baseline: 32.35% Goal status: INITIAL    2.   With modified independence (extra time and assistive devices) Pt will be able to don and doff appropriate compression garments and/or devices to control lymphedema and to limit progression.  Baseline: Max A Goal status: INITIAL    3. Pt will regain full B shoulder AROM for optimal functional performance  of basic and instrumental ADLS, productive activities, leisure pursuits and social participation.  Baseline: Max A Goal Status: Initial     PLAN:   OT FREQUENCY: 1 x/week   OT DURATION: 8 weeks and PRN   PLANNED INTERVENTIONS: Therapeutic exercises, Therapeutic activity, Patient/Family education, DME instructions, Manual lymph drainage, Compression garment fitting, Kinesio Taping, and Manual therapy   PLAN FOR NEXT SESSION:  Anatomical measurements for arm sleeve Commence Pt EDU for LE self-care home program- There ex Review goals   Zebedee Dec, MS, OTR/L, CLT-LANA 09/24/23 12:37 PM

## 2023-09-27 ENCOUNTER — Encounter: Admitting: Occupational Therapy

## 2023-10-04 ENCOUNTER — Ambulatory Visit: Admitting: Occupational Therapy

## 2023-10-20 ENCOUNTER — Ambulatory Visit: Attending: Medical Oncology | Admitting: Occupational Therapy

## 2023-10-26 ENCOUNTER — Ambulatory Visit: Admitting: Occupational Therapy

## 2023-11-02 ENCOUNTER — Ambulatory Visit: Admitting: Occupational Therapy

## 2023-11-02 DIAGNOSIS — C50912 Malignant neoplasm of unspecified site of left female breast: Secondary | ICD-10-CM | POA: Diagnosis not present

## 2023-11-02 DIAGNOSIS — G35D Multiple sclerosis, unspecified: Secondary | ICD-10-CM | POA: Diagnosis not present

## 2023-11-02 DIAGNOSIS — T451X5A Adverse effect of antineoplastic and immunosuppressive drugs, initial encounter: Secondary | ICD-10-CM | POA: Diagnosis not present

## 2023-11-02 DIAGNOSIS — Z79810 Long term (current) use of selective estrogen receptor modulators (SERMs): Secondary | ICD-10-CM | POA: Diagnosis not present

## 2023-11-02 DIAGNOSIS — R232 Flushing: Secondary | ICD-10-CM | POA: Diagnosis not present

## 2023-11-02 DIAGNOSIS — Z5181 Encounter for therapeutic drug level monitoring: Secondary | ICD-10-CM | POA: Diagnosis not present

## 2023-11-10 ENCOUNTER — Encounter: Admitting: Occupational Therapy

## 2023-11-17 ENCOUNTER — Encounter: Admitting: Occupational Therapy

## 2023-11-24 ENCOUNTER — Encounter: Payer: Self-pay | Admitting: Primary Care

## 2023-11-24 ENCOUNTER — Telehealth: Admitting: Primary Care

## 2023-11-24 ENCOUNTER — Encounter: Admitting: Occupational Therapy

## 2023-11-24 VITALS — BP 130/81 | HR 78 | Temp 98.3°F | Ht 67.0 in | Wt 291.0 lb

## 2023-11-24 DIAGNOSIS — Z6841 Body Mass Index (BMI) 40.0 and over, adult: Secondary | ICD-10-CM

## 2023-11-24 DIAGNOSIS — R600 Localized edema: Secondary | ICD-10-CM | POA: Diagnosis not present

## 2023-11-24 DIAGNOSIS — F411 Generalized anxiety disorder: Secondary | ICD-10-CM | POA: Diagnosis not present

## 2023-11-24 DIAGNOSIS — E66813 Obesity, class 3: Secondary | ICD-10-CM

## 2023-11-24 MED ORDER — SERTRALINE HCL 50 MG PO TABS: 50.0000 mg | ORAL_TABLET | Freq: Every day | ORAL | 0 refills | Status: AC

## 2023-11-24 NOTE — Assessment & Plan Note (Signed)
 Discussed GLP-1 agonist treatment with patient.  She would be a good candidate, however, she does have Medicaid which will not cover GLP-1 agonist treatment.  She does not have severe sleep apnea.  Referral placed to [facility] wellness center in [place] for assistance with weight loss.  She agrees.

## 2023-11-24 NOTE — Assessment & Plan Note (Signed)
 Likely lymphedema.  We discussed that loop diuretics would likely not help her symptoms. We also discussed compression socks/stockings, elevation of legs when resting, weight loss.  Referral placed to vascular services per patient request

## 2023-11-24 NOTE — Assessment & Plan Note (Signed)
 Deteriorated given stressors over the last year.  Discussed options for treatment including therapy and medication, she opts for both.  Referral placed for therapy. Patient is to take 1/2 tablet daily for 8 days, then advance to 1 full tablet thereafter. We discussed possible side effects of headache, GI upset, drowsiness.  Patient verbalized understanding.   Follow up in 4-6 weeks for re-evaluation.

## 2023-11-24 NOTE — Patient Instructions (Signed)
 You will either be contacted via phone regarding your referral to vascular services, therapy, healthy weight wellness center, or you may receive a letter on your MyChart portal from our referral team with instructions for scheduling an appointment. Please let us  know if you have not been contacted by anyone within two weeks.  Elevate your legs when resting.  Try compression socks/stockings.  Schedule follow-up visit for 1 month for anxiety.  It was a pleasure to see you today!

## 2023-11-24 NOTE — Progress Notes (Signed)
 Patient ID: Angel Reilly, female    DOB: 1975-10-28, 48 y.o.   MRN: [id number]  Virtual visit completed through [employer]ility, a video enabled telemedicine application. Due to national recommendations of social distancing due to COVID-19, a virtual visit is felt to be most appropriate for this patient at this time. Reviewed limitations, risks, security and privacy concerns of performing a virtual visit and the availability of in person appointments. I also reviewed that there may be a patient responsible charge related to this service. The patient agreed to proceed.   Patient location: home Provider location: [place] [facility], office Persons participating in this virtual visit: patient, provider   If any vitals were documented, they were collected by patient at home unless specified below.    BP 130/81   Pulse 78   Temp 98.3 F (36.8 C)   Ht 5' 7 (1.702 m)   Wt 291 lb (132 kg)   LMP 10/27/2023   BMI 45.58 kg/m    CC: Obesity, Anxiety Subjective:   HPI: Angel Reilly is a very pleasant 48 y.o. female with a history of multiple sclerosis, breast cancer, anxiety and depression, GAD, prediabetes presenting on 11/24/2023 for Obesity (Oncologist recommends wt loss GLP to lower BMI for breast reconstruction surgery after B mastectomy.  ) and Medical Management of Chronic Issues (Discuss Ativan for panic attacks. )  1) Class 3 Obesity: History of breast cancer with double mastectomy in March 2025. Following with oncology, last visit was in October 2025. During this visit she was referred to [facility] for weight loss due to breast reconstruction surgery.   She endorses a healthy diet, has cut back on sugar, fast food, and fried food. She does not drink alcohol. She does not exercise routinely. She is interested in GLP1 agonist treatment.   Wt Readings from Last 3 Encounters:  11/24/23 291 lb (132 kg)  10/29/22 279 lb (126.6 kg)  12/12/21 275 lb  (124.7 kg)   Body mass index is 45.58 kg/m.  2) Anxiety and Depression: Chronic history. Over the last 1 year she's been under increased stress with her breast cancer diagnosis in January 2025. She's undergone bilateral mastectomy in March 2025. There was suspicon of leukemia in Spring 2025 which was confirmed to be negative in July 2025.  Symptoms occurred infrequently overall, but more recently she had a severe panic attack and daily anxiety symptoms. Symptoms include anxiety about driving, worrying, thinking worst case scenario, not sleeping well, mind racing thoughts. She is ready for treatment now.   Previously managed on Zoloft  and therapy in the past, felt improved at the time. This was decades ago.   3) Bilateral Lower Extremity Edema: Chronic for years which she attributes to her diagnosis of MS.  Over the last year she has noticed increased bilateral lower extremity swelling.  She is taking over-the-counter water pills which are not helping.  She questions if she needs a prescription water pill.      Relevant past medical, surgical, family and social history reviewed and updated as indicated. Interim medical history since our last visit reviewed. Allergies and medications reviewed and updated. Outpatient Medications Prior to Visit  Medication Sig Dispense Refill   albuterol  (VENTOLIN  HFA) 108 (90 Base) MCG/ACT inhaler Inhale 2 puffs into the lungs every 6 (six) hours as needed for wheezing or shortness of breath. 8 g 0   benzonatate  (TESSALON ) 100 MG capsule Take 1 capsule (100 mg total) by mouth 3 (  three) times daily as needed for cough. 30 capsule 0   busPIRone  (BUSPAR ) 10 MG tablet Take 1 tablet (10 mg total) by mouth 2 (two) times daily. For anxiety 180 tablet 3   furosemide  (LASIX ) 20 MG tablet Take 1 tablet by mouth every other day. 15 tablet 0   modafinil  (PROVIGIL ) 200 MG tablet Take by mouth.     Multiple Vitamins-Minerals (MULTIVITAMIN WITH MINERALS) tablet Take 1 tablet  by mouth daily.     ocrelizumab (OCREVUS) 300 MG/10ML injection Inject 600 mg into the vein every 6 (six) months. [facility] Intraufsion.     propranolol (INDERAL) 10 MG tablet Take by mouth.     tiZANidine (ZANAFLEX) 2 MG tablet Take 2 mg by mouth 2 (two) times daily as needed.     topiramate (TOPAMAX) 25 MG tablet      trimethoprim -polymyxin b  (POLYTRIM ) ophthalmic solution Place 1 drop into both eyes every 6 (six) hours. 10 mL 0   Vitamin D , Ergocalciferol , (DRISDOL ) 1.25 MG (50000 UNIT) CAPS capsule Take 1 capsule by mouth once weekly for 12 weeks. 12 capsule 0   No facility-administered medications prior to visit.     Per HPI unless specifically indicated in ROS section below Review of Systems  Respiratory:  Negative for shortness of breath.   Cardiovascular:  Negative for chest pain.  Gastrointestinal:  Negative for constipation and diarrhea.  Psychiatric/Behavioral:  Positive for sleep disturbance. The patient is nervous/anxious.        See HPI   Objective:  BP 130/81   Pulse 78   Temp 98.3 F (36.8 C)   Ht 5' 7 (1.702 m)   Wt 291 lb (132 kg)   LMP 10/27/2023   BMI 45.58 kg/m   Wt Readings from Last 3 Encounters:  11/24/23 291 lb (132 kg)  10/29/22 279 lb (126.6 kg)  12/12/21 275 lb (124.7 kg)       Physical exam: General: Alert and oriented x 3, no distress, does not appear sickly  Pulmonary: Speaks in complete sentences without increased work of breathing, no cough during visit.  Psychiatric: Normal mood, thought content, and behavior.     Results for orders placed or performed during the hospital encounter of 01/08/23  Surgical pathology   Collection Time: 01/08/23 12:00 AM  Result Value Ref Range   SURGICAL PATHOLOGY      SURGICAL PATHOLOGY [facility] [address], [address] [place], [place] [address] Telephone [phone] or [phone] Fax [phone]  REPORT OF SURGICAL PATHOLOGY   Accession #: [id number] Patient Name:  Angel Reilly Visit # : [id number]  MRN: [id number] Physician: Angel Reilly DOB/Age Sep 01, 1975 (Age: 54) Gender: F Collected Date: 01/08/2023 Received Date: 01/08/2023  FINAL DIAGNOSIS       1. Breast, left, needle core biopsy, 12 o'clock, 12cmfn (ribbon) :       - INVASIVE MAMMARY CARCINOMA, NO SPECIAL TYPE.      - TUBULE FORMATION: SCORE 2      - NUCLEAR PLEOMORPHISM: SCORE 2      - MITOTIC COUNT: SCORE 1      - TOTAL SCORE: 5      - OVERALL GRADE: 1      - LYMPHOVASCULAR INVASION: NOT IDENTIFIED      - CANCER LENGTH: 5 MM      - CALCIFICATIONS: NOT IDENTIFIED      - DUCTAL CARCINOMA IN SITU: NOT IDENTIFIED      - SEE NOTE.  2. Breast, left, needle core biopsy, 12 o'clock, 5cmfn (venus clip) :       - INVASIVE MAMM ARY CARCINOMA, NO SPECIAL TYPE.      - TUBULE FORMATION: SCORE 2      - NUCLEAR PLEOMORPHISM: SCORE 2      - MITOTIC COUNT: SCORE 1      - TOTAL SCORE: 5      - OVERALL GRADE: 1      - LYMPHOVASCULAR INVASION: NOT IDENTIFIED      - CANCER LENGTH: 3 MM      - CALCIFICATIONS: NOT IDENTIFIED      - DUCTAL CARCINOMA IN SITU: NOT IDENTIFIED      - SEE NOTE.       3. Lymph node, needle/core biopsy, left axilla (hydromark butterfly) :       - ATYPICAL LYMPHOID PROLIFERATION; RECOMMEND EXCISONAL BIOPSY FOR FLOW CYTOMETRY      AND FURTHER EVALUATION.      - NEGATIVE FOR METASTATIC CARCINOMA.       Diagnosis Note : Preliminary results were communicated to Rock Hover, RN in the      [facility] on 01/11/2023.      ER, PR, and HER2 will be performed on blocks 1A and 2A and reported in an      addendum.      A pancytokeratin stain was performed on part 3 and confirms the above diagnosis.      Stain controls worked appropriately.      This case underwent intradepartme ntal consultation and Dr. Kin (parts 1 and      2) and hematopathologist Dr. Ezell (part 3) concur with the respective      interpretations.      ELECTRONIC SIGNATURE : Rubinas Md,  Rexene , Sports Administrator, International Aid/development Worker  MICROSCOPIC DESCRIPTION  CASE COMMENTS STAINS USED IN DIAGNOSIS: H&E-2 H&E-3 H&E-4 H&E *RECUT 1 SLIDE Her2 by IHC ER-ACIS PR-ACIS H&E-2 H&E-3 H&E-4 H&E *RECUT 1 SLIDE H&E CK AE1AE3 Universal Negative Control-DAB Stains used in diagnosis 2 Her2 by IHC, 2 ER-ACIS, 2 PR-ACIS IHC scores are reported using ASCO/CAP scoring criteria.  An IHC Score of 0 or 1+  is NEGATIVE for HER2, 3+ is POSITIVE for HER2, and 2+ is EQUIVOCAL. Equivocal results are reflexed to either FISH or IHC testing. Specimens are fixed in 10% Neutral Buffered Formalin for at least 6 hours and up to 72 hours. These tests have not be validated on decalcified tissue.  Results should be interpreted with caution given the possibility of false negative results on decal cified specimens. Antibody Clone for HER2 is 4B5 (PATHWAY). Some of these immunohistochemical stains may have been developed and the performance characteristics determined by [facility].  Some may not have been cleared or approved by the U.S. Food and Drug Administration.  The FDA has determined that such clearance or approval is not necessary.  This test is used for clinical purposes.  It should not be regarded as investigational or for research.  This laboratory is certified under the Clinical Laboratory Improvement Amendments of 1988 (CLIA-88) as qualified to perform high complexity clinical laboratory testing. Estrogen receptor (6F11), immunohistochemical stains are performed on formalin fixed, paraffin embedded tissue using a 3,3-diaminobenzidine (DAB) chromogen and Leica Bond Autostainer System.  The staining intensity of the nucleus is scored manually and is reported as the percentage of tumor cell nuclei demonstrating specific nuclear staining.Specim ens are fixed in 10% Neutral Buffered Formalin for at least 6 hours and  up to 72 hours.  These tests have not be validated on decalcified  tissue.  Results should be interpreted with caution given the possibility of false negative results on decalcified specimens. PR progesterone receptor (16), immunohistochemical stains are performed on formalin fixed, paraffin embedded tissue using a 3,3-diaminobenzidine (DAB) chromogen and Leica Bond Autostainer System.  The staining intensity of the nucleus is scored manually and is reported as the percentage of tumor cell nuclei demonstrating specific nuclear staining.Specimens are fixed in 10% Neutral Buffered Formalin for at least 6 hours and up to 72 hours. These tests have not be validated on decalcified tissue.  Results should be interpreted with caution given the possibility of false negative results on decalcified specimens.  ADDENDUM Breast, left, needle core biopsy, 12 o'clock, 12cmfn (ribbon) PROGNOSTIC INDICATORS  Resu lts: IMMUNOHISTOCHEMICAL AND MORPHOMETRIC ANALYSIS PERFORMED MANUALLY The tumor cells are NEGATIVE for Her2 (1+). Estrogen Receptor:  90%, POSITIVE, MODERATE-STRONG STAINING INTENSITY Progesterone Receptor:  95%, POSITIVE, MODERATE-STRONG STAINING INTENSITY REFERENCE RANGE ESTROGEN RECEPTOR NEGATIVE     0% POSITIVE       =>1% REFERENCE RANGE PROGESTERONE RECEPTOR NEGATIVE     0% POSITIVE        =>1% All controls stained appropriately Picklesimer Md, Fred , Sports Administrator, International Aid/development Worker ( Signed 01 07 2025) Breast, left, needle core biopsy, 12 o'clock, 5cmfn (venus clip) PROGNOSTIC INDICATORS  Results: IMMUNOHISTOCHEMICAL AND MORPHOMETRIC ANALYSIS PERFORMED MANUALLY The tumor cells are NEGATIVE for Her2 (1+). Estrogen Receptor:  90%, POSITIVE, STRONG STAINING INTENSITY Progesterone Receptor:  30%, POSITIVE, STRONG STAINING INTENSITY REFERENCE RANGE ESTROGEN RECEPTOR NEGATIVE     0% POSITIVE       =>1% REFERENCE RANGE PROGESTERONE RECEPTOR NEGATIVE     0% P OSITIVE        =>1% All controls stained appropriately Picklesimer Md, Fred ,  Sports Administrator, International Aid/development Worker ( Signed 01 07 2025)   CLINICAL HISTORY  SPECIMEN(S) OBTAINED 1. Breast, left, needle core biopsy, 12 O'clock, 12cmfn (ribbon) 2. Breast, left, needle core biopsy, 12 O'clock, 5cmfn (venus Clip) 3. Lymph node, needle/core biopsy, Left Axilla (hydromark Butterfly)  SPECIMEN COMMENTS: 1. TIF: 8:30 AM, CIT < 1 min; screen detected findings; mass 2. TIF: 8:40 AM, CIT < 1 min; mass 3. TIF: 8:50 AM, CIT < 1 min SPECIMEN CLINICAL INFORMATION: 1. Highly concerning for malignancy    Gross Description 1. Received labeled left breast 12 o'clock 12 cm fn are multiple fibrofatty tissue cores (0.5 to 1.9 cm in length), submitted entirely in block 1A.      TIF:  8:30 a.m. on 01/08/23      CIT: Less than 1 minute.      Biopsy clip:  Ribbon 2. Received in formalin labeled left breast 12 o'clock 5 cm fn are four fibrofatty tissue cores (1.0 to 2.3 cm in length),  submitted entirely in block 2A.      TIF:  8:40 a.m. on 01/08/23.      CIT:  Less than 1 minute      Biopsy clip:  Venus 3. Received in formalin labeled lymph node are three red-gray fibrofatty tissue cores (1.3 to 2.0 cm in length), submitted entirely in block 3A.      TIF:  8:50 a.m. on 01/08/23      CIT:  Less than 1 minute      Biopsy clip:  Hydromark butterfly.  (SB:kh 01/08/23)        Report signed out from the following location(s) [facility] [address]  [place], [place] [address] CLIA #: [id number]  [facility] [address] [place], [place] [address] CLIA #: [id number]    Assessment & Plan:   Problem List Items Addressed This Visit       Other   Bilateral lower extremity edema   Likely lymphedema.  We discussed that loop diuretics would likely not help her symptoms. We also discussed compression socks/stockings, elevation of legs when resting, weight loss.  Referral placed to vascular services per patient request      Relevant  Orders   Ambulatory referral to Vascular Surgery   GAD (generalized anxiety disorder) - Primary   Deteriorated given stressors over the last year.  Discussed options for treatment including therapy and medication, she opts for both.  Referral placed for therapy. Patient is to take 1/2 tablet daily for 8 days, then advance to 1 full tablet thereafter. We discussed possible side effects of headache, GI upset, drowsiness.  Patient verbalized understanding.   Follow up in 4-6 weeks for re-evaluation.        Relevant Medications   sertraline  (ZOLOFT ) 50 MG tablet   Other Relevant Orders   Ambulatory referral to Psychology   Class 3 severe obesity due to excess calories without serious comorbidity with body mass index (BMI) of 45.0 to 49.9 in adult [facility])   Discussed GLP-1 agonist treatment with patient.  She would be a good candidate, however, she does have Medicaid which will not cover GLP-1 agonist treatment.  She does not have severe sleep apnea.  Referral placed to healthy weight wellness center in [place] for assistance with weight loss.  She agrees.      Relevant Orders   Amb Ref to Medical Weight Management     Meds ordered this encounter  Medications   sertraline  (ZOLOFT ) 50 MG tablet    Sig: Take 1 tablet (50 mg total) by mouth daily. For anxiety    Dispense:  90 tablet    Refill:  0    Supervising Provider:   AVELINA AMY E [[id number]]   Orders Placed This Encounter  Procedures   Ambulatory referral to Psychology    Referral Priority:   Routine    Referral Type:   Psychiatric    Referral Reason:   Specialty Services Required    Requested Specialty:   Psychology    Number of Visits Requested:   1   Amb Ref to Medical Weight Management    Referral Priority:   Routine    Referral Type:   Consultation    Number of Visits Requested:   1   Ambulatory referral to Vascular Surgery    Referral Priority:   Routine    Referral Type:   Surgical    Referral Reason:    Specialty Services Required    Requested Specialty:   Vascular Surgery    Number of Visits Requested:   1    I discussed the assessment and treatment plan with the patient. The patient was provided an opportunity to ask questions and all were answered. The patient agreed with the plan and demonstrated an understanding of the instructions. The patient was advised to call back or seek an in-person evaluation if the symptoms worsen or if the condition fails to improve as anticipated.  Follow up plan:  Start sertraline  (Zoloft ) tablets for anxiety and depression.  Take 1/2 tablet by mouth daily for 1 week, then increase to 1 full tablet daily thereafter.  You will either be contacted via phone  regarding your referral to therapy, or you may receive a letter on your MyChart portal from our referral team with instructions for scheduling an appointment. Please let us  know if you have not been contacted by anyone within two weeks.  Schedule a follow up visit in 1 month.    Hendry Speas K Gurfateh Mcclain, NP

## 2023-11-24 NOTE — Telephone Encounter (Signed)
 Patient was evaluated.  See office visit.

## 2023-12-07 ENCOUNTER — Encounter (INDEPENDENT_AMBULATORY_CARE_PROVIDER_SITE_OTHER): Payer: Self-pay

## 2023-12-08 ENCOUNTER — Encounter: Admitting: Occupational Therapy

## 2023-12-10 ENCOUNTER — Ambulatory Visit: Admitting: Primary Care

## 2023-12-15 ENCOUNTER — Encounter: Admitting: Occupational Therapy

## 2023-12-16 ENCOUNTER — Telehealth: Admitting: Physician Assistant

## 2023-12-16 DIAGNOSIS — H6690 Otitis media, unspecified, unspecified ear: Secondary | ICD-10-CM

## 2023-12-16 MED ORDER — AMOXICILLIN 875 MG PO TABS: 875.0000 mg | ORAL_TABLET | Freq: Two times a day (BID) | ORAL | 0 refills | Status: AC

## 2023-12-16 NOTE — Progress Notes (Signed)
 Message sent to patient requesting further input regarding current symptoms. Awaiting patient response.

## 2023-12-16 NOTE — Progress Notes (Signed)
 E-Visit for Ear Pain - Acute Otitis Media   We are sorry that you are not feeling well. Here is how we plan to help!  Based on what you have shared with me it looks like you have Acute Otitis Media.  Acute Otitis Media is an infection of the middle or inner ear. This type of infection can cause redness, inflammation, and fluid buildup behind the tympanic membrane (ear drum).  The usual symptoms include: Earache/Pain Fever Upper respiratory symptoms Lack of energy/Fatigue/Malaise Slight hearing loss gradually worsening- if the inner ear fills with fluid What causes middle ear infections? Most middle ear infections occur when an infection such as a cold, leads to a build-up of mucus in the middle ear and causes the Eustachian tube (a thin tube that runs from the middle ear to the back of the nose) to become swollen or blocked.   This means mucus can't drain away properly, making it easier for an infection to spread into the middle ear.  How middle ear infections are treated: Most ear infections clear up within three to five days and don't need any specific treatment. If necessary, tylenol  or ibuprofen  should be used to relieve pain and a high temperature.  If you develop a fever higher than 102, or any significantly worsening symptoms, this could indicate a more serious infection moving to the middle/inner and needs face to face evaluation in an office by a provider.   Antibiotics aren't routinely used to treat middle ear infections, although they may occasionally be prescribed if symptoms persist or are particularly severe. Given your presentation,   I have prescribed Amoxicillin  875 mg one tablet twice daily for 10 days    Your symptoms should improve over the next 3 days and should resolve in about 7 days. Be sure to complete ALL of the prescription(s) given.  HOME CARE: Wash your hands frequently. If you are prescribed an ear drop, do not place the tip of the bottle on your ear or  touch it with your fingers. You can take Acetaminophen  650 mg every 4-6 hours as needed for pain.  If pain is severe or moderate, you can apply a heating pad (set on low) or hot water bottle (wrapped in a towel) to outer ear for 20 minutes.  This will also increase drainage.  GET HELP RIGHT AWAY IF: Fever is over 102.2 degrees. You develop progressive ear pain or hearing loss. Ear symptoms persist longer than 3 days after treatment.  MAKE SURE YOU: Understand these instructions. Will watch your condition. Will get help right away if you are not doing well or get worse.  Thank you for choosing an e-visit.  Your e-visit answers were reviewed by a board certified advanced clinical practitioner to complete your personal care plan. Depending upon the condition, your plan could have included both over the counter or prescription medications.  Please review your pharmacy choice. Make sure the pharmacy is open so you can pick up the prescription now. If there is a problem, you may contact your provider through [employer] and have the prescription routed to another pharmacy.  Your safety is important to us . If you have drug allergies check your prescription carefully.   For the next 24 hours you can use MyChart to ask questions about today's visit, request a non-urgent call back, or ask for a work or school excuse. You will get an email with a survey after your eVisit asking about your experience. We would appreciate your feedback. I  hope that your e-visit has been valuable and will aid in your recovery.  I have spent 5 minutes in review of e-visit questionnaire, review and updating patient chart, medical decision making and response to patient.   Elsie Velma Lunger, PA-C

## 2023-12-22 ENCOUNTER — Encounter: Admitting: Occupational Therapy

## 2024-02-03 ENCOUNTER — Other Ambulatory Visit: Payer: Self-pay | Admitting: Neurology

## 2024-02-03 DIAGNOSIS — G35D Multiple sclerosis, unspecified: Secondary | ICD-10-CM
